# Patient Record
Sex: Female | Born: 1985 | Race: White | Hispanic: No | Marital: Single | Smoking: Current every day smoker
Health system: Southern US, Community
[De-identification: ages and names within clinical notes are randomized; demographics above are authoritative.]

## PROBLEM LIST (undated history)

## (undated) DIAGNOSIS — J45909 Unspecified asthma, uncomplicated: Secondary | ICD-10-CM

## (undated) DIAGNOSIS — F419 Anxiety disorder, unspecified: Secondary | ICD-10-CM

---
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
## 2004-09-30 ENCOUNTER — Emergency Department (HOSPITAL_COMMUNITY): Admission: EM | Admit: 2004-09-30 | Discharge: 2004-09-30 | Payer: Self-pay | Admitting: Emergency Medicine

## 2004-11-14 ENCOUNTER — Emergency Department (HOSPITAL_COMMUNITY): Admission: EM | Admit: 2004-11-14 | Discharge: 2004-11-14 | Payer: Self-pay | Admitting: Emergency Medicine

## 2004-11-24 ENCOUNTER — Ambulatory Visit: Payer: Self-pay | Admitting: Nurse Practitioner

## 2004-11-28 ENCOUNTER — Inpatient Hospital Stay (HOSPITAL_COMMUNITY): Admission: RE | Admit: 2004-11-28 | Discharge: 2004-12-04 | Payer: Self-pay | Admitting: Obstetrics

## 2004-11-29 ENCOUNTER — Encounter (INDEPENDENT_AMBULATORY_CARE_PROVIDER_SITE_OTHER): Payer: Self-pay | Admitting: *Deleted

## 2004-12-10 ENCOUNTER — Emergency Department (HOSPITAL_COMMUNITY): Admission: EM | Admit: 2004-12-10 | Discharge: 2004-12-10 | Payer: Self-pay | Admitting: Emergency Medicine

## 2005-04-01 ENCOUNTER — Inpatient Hospital Stay (HOSPITAL_COMMUNITY): Admission: AD | Admit: 2005-04-01 | Discharge: 2005-04-01 | Payer: Self-pay | Admitting: Obstetrics

## 2005-04-26 ENCOUNTER — Ambulatory Visit: Payer: Self-pay | Admitting: Family Medicine

## 2005-05-31 ENCOUNTER — Ambulatory Visit (HOSPITAL_COMMUNITY): Admission: RE | Admit: 2005-05-31 | Discharge: 2005-05-31 | Payer: Self-pay | Admitting: Obstetrics

## 2005-10-20 ENCOUNTER — Inpatient Hospital Stay (HOSPITAL_COMMUNITY): Admission: AD | Admit: 2005-10-20 | Discharge: 2005-10-22 | Payer: Self-pay | Admitting: Obstetrics

## 2007-10-14 ENCOUNTER — Emergency Department (HOSPITAL_COMMUNITY): Admission: EM | Admit: 2007-10-14 | Discharge: 2007-10-14 | Payer: Self-pay | Admitting: Emergency Medicine

## 2008-01-24 ENCOUNTER — Inpatient Hospital Stay (HOSPITAL_COMMUNITY): Admission: AD | Admit: 2008-01-24 | Discharge: 2008-01-24 | Payer: Self-pay | Admitting: Obstetrics & Gynecology

## 2008-08-26 ENCOUNTER — Emergency Department (HOSPITAL_COMMUNITY): Admission: EM | Admit: 2008-08-26 | Discharge: 2008-08-26 | Payer: Self-pay | Admitting: Emergency Medicine

## 2010-05-14 ENCOUNTER — Emergency Department (HOSPITAL_COMMUNITY): Admission: EM | Admit: 2010-05-14 | Discharge: 2010-05-14 | Payer: Self-pay | Admitting: Emergency Medicine

## 2010-12-01 NOTE — Consult Note (Signed)
NAMESHAUGHNESSY, GETHERS            ACCOUNT NO.:  [id number]   MEDICAL RECORD NO.:  [id number]          PATIENT TYPE:  INP   LOCATION:  9373                          FACILITY:  [facility]   PHYSICIAN:  Michael L. Reynolds, M.D.DATE OF BIRTH:  [birth year]   DATE OF CONSULTATION:  11/28/2004  DATE OF DISCHARGE:                                   CONSULTATION   REFERRED BY:  Roseanna Rainbow, M.D.   REASON FOR CONSULTATION:  Seizure.   HISTORY OF PRESENT ILLNESS:  This is the initial inpatient consultation  evaluation of this 25 year old woman with a past medical history which  includes depression for which she has been previously hospitalized. The  patient was admitted to [facility] on May 16 after presenting to a  routine prenatal visit at the gynecologist office and at that time was found  to have no fetal heart tones. She was preeclamptic at that time and was  admitted for evaluation. She was found to have an intrauterine fetal demise  and the baby was delivered without signs of life at about 1 p.m. yesterday.  Neurologic consultation requested for possible seizure. The patient reports  that there was a seizure episode about a week ago. She does not really  recall anything about the episode but she understands that she had some  shaking with twitching and turning of her arms and legs and loss of  consciousness. Again this happened a couple of days before she presented to  her OB's office. She denies having any previous history of any seizure  events or any alteration of consciousness. She does have a history of  illicit drug use and exactly what she took in the days prior to admission is  in question.   PAST MEDICAL HISTORY:  She has been hospitalized for anxiety and depression  in the past. She has a history of asthma for which she takes medications. Of  note, she was unaware of her pregnancy until about two weeks ago. There is  no previous history of seizure or previous  history of a head injury,  encephalitis or meningitis, etc.   FAMILY HISTORY:  Negative for seizure.   REVIEW OF SYMPTOMS:  Per HPI and admission H&P.   MEDICATIONS:  Medications prior to admission include hydrocodone, Advair,  albuterol and Singulair. In the hospital, she is presently on a magnesium  drip. She received Ativan, Phenergan and Ambien p.r.n.   SOCIAL HISTORY:  The patient plans to discharge home with her mother with  whom she resides. She does have a history of illicit drug use including  cocaine and marijuana in the past. Admission drug screen was positive for  barbiturates, the source of which is uncertain, but negative for THC,  opiates or cocaine. She has a difficult social situation regarding her  pregnancy which is well detailed in the progress notes.   PHYSICAL EXAMINATION:  VITAL SIGNS:  Temperature 98.3, blood pressure  130/80, pulse 85, respirations 20, O2 sat 95% on room air.  GENERAL:  This is a healthy appearing female supine in the hospital bed with  no evidence of distress.  HEENT:  Head normocephalic, atraumatic, oropharynx benign.  NECK:  Supple without carotid bruits.  HEART:  Regular rate and rhythm without murmurs.  NEUROLOGIC:  Mental status:  She is awake and alert, she is oriented to  place and time. She is appropriately interactive with the examiner. She is  able to name objects without difficulty and speech is clear and not  dysarthric. Cranial nerves:  Funduscopic exam is benign. Pupils are equal  and briskly reactive. Extraocular movements are full without nystagmus.  Visual fields are full to confrontation. Face, tongue and palate move  normally and symmetrically. Motor:  Normal bulk and tone, normal strength in  all tested extremity muscles.  Sensation: intact to light touch in all  extremities. Coordination:  Finger to nose performed adequately. Gait exam  is deferred as she is on bed rest. Reflexes 2+ and symmetric, toes are   downgoing.   LABORATORY DATA:  Labs from daily notes:  Yesterday she had an elevated  white count of 14.4, hemoglobin 12.3, platelets 218,000. CMET remarkable for  low total protein and albumin with an appropriately low calcium. This  morning white count is down to 13.2. Drug screen is as reported above. She  has had no neural imaging. CT of the head performed one year ago was  unremarkable. She has had no recent neural imaging.   IMPRESSION:  Reported history of a single generalized seizure occuring in  the setting of subsequent admission for toxemia. Suspect that this is likely  an eclamptic seizure. She has not had any previous history of seizures and  has a normal neurologic examination at this time, therefore, she is a low  risk of this happening again.   PLAN:  Would not treat for seizures right now. Given that she has not had  recent neuroimaging, it would be reasonable to recheck a CT of the head  without contrast. Beyond that, I would not suggest any further interventions  unless she has more events.   Thank you for the consultation.      MLR/MEDQ  D:  11/30/2004  T:  11/30/2004  Job:  [id number]
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
## 2010-12-01 NOTE — Op Note (Signed)
Joy Herrera, Joy Herrera            ACCOUNT NO.:  [id number]   MEDICAL RECORD NO.:  [id number]          PATIENT TYPE:  INP   LOCATION:  9118                          FACILITY:  [facility]   PHYSICIAN:  Kathreen Cosier, M.D.DATE OF BIRTH:  Mar 27, 1986   DATE OF PROCEDURE:  10/20/2005  DATE OF DISCHARGE:                                 OPERATIVE REPORT   DELIVERY NOTE:  The patient is a 25 year old, gravida 2, para 0-1-0-0, who  was admitted in labor, ruptured membranes, at term.  Became fully dilated at  7:00 p.m. and pushed for 2 hours to +3 station.  She was dilated, needed  help, and asked for vacuum.  A midline episiotomy was cut.  Vacuum applied  +3 station through one contraction, and the vertex was delivered.  There was  no pop-off.  LOA.  Intact nuchal cord cut prior to delivery.  Placenta was  spontaneous, intact.  Midline episiotomy repaired with 2-0 Vicryl.  Apgar  scores were 8 and 9.  The patient tolerated the procedure well.           ______________________________  Kathreen Cosier, M.D.     BAM/MEDQ  D:  10/20/2005  T:  10/22/2005  Job:  [id number]
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
## 2010-12-12 ENCOUNTER — Emergency Department (HOSPITAL_COMMUNITY)
Admission: EM | Admit: 2010-12-12 | Discharge: 2010-12-12 | Disposition: A | Discharge status: Home / Self Care | Payer: Medicaid Other | Attending: Emergency Medicine | Admitting: Emergency Medicine

## 2010-12-12 ENCOUNTER — Emergency Department (HOSPITAL_COMMUNITY): Payer: Medicaid Other

## 2010-12-12 DIAGNOSIS — J189 Pneumonia, unspecified organism: Secondary | ICD-10-CM | POA: Insufficient documentation

## 2010-12-12 DIAGNOSIS — R059 Cough, unspecified: Secondary | ICD-10-CM | POA: Insufficient documentation

## 2010-12-12 DIAGNOSIS — R05 Cough: Secondary | ICD-10-CM | POA: Insufficient documentation

## 2011-04-09 LAB — URINALYSIS, ROUTINE W REFLEX MICROSCOPIC
Bilirubin Urine: NEGATIVE
Specific Gravity, Urine: 1.025
Urobilinogen, UA: 1

## 2011-04-09 LAB — URINE MICROSCOPIC-ADD ON

## 2011-04-12 LAB — CBC
HCT: 38.7
Hemoglobin: 13.6
RBC: 4.26

## 2011-04-12 LAB — WET PREP, GENITAL: Trich, Wet Prep: NONE SEEN

## 2011-04-12 LAB — URINALYSIS, ROUTINE W REFLEX MICROSCOPIC
Bilirubin Urine: NEGATIVE
Glucose, UA: NEGATIVE
Nitrite: NEGATIVE
Specific Gravity, Urine: 1.02
pH: 6.5

## 2011-04-12 LAB — DIFFERENTIAL
Eosinophils Relative: 5
Lymphocytes Relative: 30
Monocytes Absolute: 0.3
Monocytes Relative: 5
Neutro Abs: 3.1

## 2011-04-12 LAB — GC/CHLAMYDIA PROBE AMP, GENITAL
Chlamydia, DNA Probe: NEGATIVE
GC Probe Amp, Genital: NEGATIVE

## 2011-07-03 ENCOUNTER — Emergency Department (HOSPITAL_COMMUNITY)
Admission: EM | Admit: 2011-07-03 | Discharge: 2011-07-03 | Disposition: A | Discharge status: Home / Self Care | Payer: Medicaid Other | Attending: Emergency Medicine | Admitting: Emergency Medicine

## 2011-07-03 ENCOUNTER — Encounter: Payer: Self-pay | Admitting: *Deleted

## 2011-07-03 DIAGNOSIS — G8929 Other chronic pain: Secondary | ICD-10-CM | POA: Insufficient documentation

## 2011-07-03 DIAGNOSIS — Z79899 Other long term (current) drug therapy: Secondary | ICD-10-CM | POA: Insufficient documentation

## 2011-07-03 DIAGNOSIS — M542 Cervicalgia: Secondary | ICD-10-CM | POA: Insufficient documentation

## 2011-07-03 MED ORDER — HYDROCODONE-ACETAMINOPHEN 5-325 MG PO TABS: 1.0000 | ORAL_TABLET | Freq: Four times a day (QID) | ORAL | Status: AC | PRN

## 2011-07-03 NOTE — ED Notes (Signed)
Pt has chronic neck pain but states that it has gotten worse in the last 2 weeks.

## 2011-07-03 NOTE — ED Provider Notes (Signed)
History     CSN: [id number] Arrival date & time: 07/03/2011  6:07 PM   None     Chief Complaint  Patient presents with  . Neck Pain    (Consider location/radiation/quality/duration/timing/severity/associated sxs/prior treatment) HPI Comments: Pt sees dr. Loney Hering in [place] for chronic neck pain.  She takes flexeril and valium regularly.  She states the [place] she takes let her sleep comfortably but she can only take it on the weekends because she must be able to wake up and get her son to school.  She plans to see dr. Loney Hering after christmas and plans to see a chiropractor.  i have suggested orthopedic evaluation first.  She wants pain medicine to help through he next week.  Patient is a 25 y.o. female presenting with neck pain. The history is provided by the patient. No language interpreter was used.  Neck Pain  This is a chronic problem. Episode onset: 8-10 yrs ago. The problem occurs constantly. There has been no fever. Pain location: R posterior lateral. The pain is worse during the night.    History reviewed. No pertinent past medical history.  History reviewed. No pertinent past surgical history.  History reviewed. No pertinent family history.  History  Substance Use Topics  . Smoking status: Former Games developer  . Smokeless tobacco: Not on file  . Alcohol Use: Yes     occasionally    OB History    Grav Para Term Preterm Abortions TAB SAB Ect Mult Living                  Review of Systems  HENT: Positive for neck pain.   Musculoskeletal:       Neck pain  All other systems reviewed and are negative.    Allergies  Review of patient's allergies indicates no known allergies.  Home Medications   Current Outpatient Rx  Name Route Sig Dispense Refill  . ALBUTEROL SULFATE HFA 108 (90 BASE) MCG/ACT IN AERS Inhalation Inhale 2 puffs into the lungs every 6 (six) hours as needed. For shortness of breath     . CYCLOBENZAPRINE HCL 10 MG PO TABS Oral Take 10 mg by mouth at  bedtime.      Marland Kitchen DIAZEPAM 5 MG PO TABS Oral Take 5 mg by mouth 3 (three) times daily as needed. For anxiety     . FLUTICASONE-SALMETEROL 500-50 MCG/DOSE IN AEPB Inhalation Inhale 1 puff into the lungs every 12 (twelve) hours.      . IBUPROFEN 200 MG PO TABS Oral Take 800 mg by mouth 2 (two) times daily.      Marland Kitchen MEDROXYPROGESTERONE ACETATE 150 MG/ML IM SUSP Intramuscular Inject 150 mg into the muscle every 3 (three) months.      Marland Kitchen NIACIN (ANTIHYPERLIPIDEMIC) 500 MG PO TBCR Oral Take 500-1,000 mg by mouth daily.      Marland Kitchen ZOLPIDEM TARTRATE 10 MG PO TABS Oral Take 10 mg by mouth at bedtime as needed. For sleep       BP 136/97  Pulse 88  Temp(Src) 98.5 F (36.9 C) (Oral)  Resp 16  Ht 5\' 4"  (1.626 m)  Wt 160 lb (72.576 kg)  BMI 27.46 kg/m2  SpO2 99%  Physical Exam  Nursing note and vitals reviewed. Constitutional: She is oriented to person, place, and time. She appears well-developed and well-nourished. No distress.  HENT:  Head: Normocephalic and atraumatic.  Eyes: EOM are normal.  Neck: Normal range of motion. Neck supple. No tracheal deviation present. No thyromegaly present.  Cardiovascular: Normal rate, regular rhythm and normal heart sounds.   Pulmonary/Chest: Effort normal and breath sounds normal.  Abdominal: Soft. She exhibits no distension. There is no tenderness.  Musculoskeletal: Normal range of motion.       Back:  Neurological: She is alert and oriented to person, place, and time. She has normal strength. No cranial nerve deficit or sensory deficit. GCS eye subscore is 4. GCS verbal subscore is 5. GCS motor subscore is 6.  Skin: Skin is warm and dry.  Psychiatric: She has a normal mood and affect. Judgment normal.    ED Course  Procedures (including critical care time)  Labs Reviewed - No data to display No results found.   No diagnosis found.    MDM          Worthy Rancher, PA 07/03/11 1941  Worthy Rancher, PA 07/04/11 [id number]

## 2011-07-04 NOTE — ED Provider Notes (Signed)
Medical screening examination/treatment/procedure(s) were performed by non-physician practitioner and as supervising physician I was immediately available for consultation/collaboration.  Nicoletta Dress. Colon Branch, MD 07/04/11 1253

## 2012-01-01 ENCOUNTER — Inpatient Hospital Stay (HOSPITAL_COMMUNITY)
Admission: EM | Admit: 2012-01-01 | Discharge: 2012-01-04 | DRG: 917 | Disposition: A | Discharge status: Home / Self Care | Payer: Medicaid Other | Attending: Internal Medicine | Admitting: Internal Medicine

## 2012-01-01 ENCOUNTER — Emergency Department (HOSPITAL_COMMUNITY): Payer: Medicaid Other

## 2012-01-01 ENCOUNTER — Encounter (HOSPITAL_COMMUNITY): Payer: Self-pay | Admitting: Emergency Medicine

## 2012-01-01 DIAGNOSIS — E876 Hypokalemia: Secondary | ICD-10-CM

## 2012-01-01 DIAGNOSIS — F17200 Nicotine dependence, unspecified, uncomplicated: Secondary | ICD-10-CM | POA: Diagnosis present

## 2012-01-01 DIAGNOSIS — D72819 Decreased white blood cell count, unspecified: Secondary | ICD-10-CM

## 2012-01-01 DIAGNOSIS — F411 Generalized anxiety disorder: Secondary | ICD-10-CM | POA: Diagnosis present

## 2012-01-01 DIAGNOSIS — N390 Urinary tract infection, site not specified: Secondary | ICD-10-CM | POA: Diagnosis present

## 2012-01-01 DIAGNOSIS — F1310 Sedative, hypnotic or anxiolytic abuse, uncomplicated: Secondary | ICD-10-CM | POA: Diagnosis present

## 2012-01-01 DIAGNOSIS — F1120 Opioid dependence, uncomplicated: Secondary | ICD-10-CM | POA: Diagnosis present

## 2012-01-01 DIAGNOSIS — T400X1A Poisoning by opium, accidental (unintentional), initial encounter: Secondary | ICD-10-CM | POA: Diagnosis present

## 2012-01-01 DIAGNOSIS — A419 Sepsis, unspecified organism: Secondary | ICD-10-CM | POA: Diagnosis present

## 2012-01-01 DIAGNOSIS — D649 Anemia, unspecified: Secondary | ICD-10-CM | POA: Diagnosis present

## 2012-01-01 DIAGNOSIS — F1910 Other psychoactive substance abuse, uncomplicated: Secondary | ICD-10-CM

## 2012-01-01 DIAGNOSIS — J45909 Unspecified asthma, uncomplicated: Secondary | ICD-10-CM | POA: Diagnosis present

## 2012-01-01 DIAGNOSIS — R509 Fever, unspecified: Secondary | ICD-10-CM

## 2012-01-01 DIAGNOSIS — T40601A Poisoning by unspecified narcotics, accidental (unintentional), initial encounter: Principal | ICD-10-CM | POA: Diagnosis present

## 2012-01-01 DIAGNOSIS — R000 Tachycardia, unspecified: Secondary | ICD-10-CM | POA: Diagnosis present

## 2012-01-01 HISTORY — DX: Anxiety disorder, unspecified: F41.9

## 2012-01-01 HISTORY — DX: Unspecified asthma, uncomplicated: J45.909

## 2012-01-01 LAB — URINALYSIS, ROUTINE W REFLEX MICROSCOPIC
Bilirubin Urine: NEGATIVE
Specific Gravity, Urine: 1.015 (ref 1.005–1.030)
pH: 5 (ref 5.0–8.0)

## 2012-01-01 LAB — DIFFERENTIAL
Basophils Absolute: 0 10*3/uL (ref 0.0–0.1)
Eosinophils Absolute: 0 10*3/uL (ref 0.0–0.7)
Eosinophils Relative: 0 % (ref 0–5)
Lymphocytes Relative: 2 % — ABNORMAL LOW (ref 12–46)

## 2012-01-01 LAB — CBC
MCH: 31.5 pg (ref 26.0–34.0)
MCV: 90.8 fL (ref 78.0–100.0)
Platelets: 270 10*3/uL (ref 150–400)
RDW: 11.8 % (ref 11.5–15.5)
WBC: 15.7 10*3/uL — ABNORMAL HIGH (ref 4.0–10.5)

## 2012-01-01 LAB — URINE MICROSCOPIC-ADD ON

## 2012-01-01 LAB — POCT PREGNANCY, URINE: Preg Test, Ur: NEGATIVE

## 2012-01-01 LAB — COMPREHENSIVE METABOLIC PANEL
ALT: 24 U/L (ref 0–35)
AST: 32 U/L (ref 0–37)
Albumin: 3.3 g/dL — ABNORMAL LOW (ref 3.5–5.2)
Calcium: 8.7 mg/dL (ref 8.4–10.5)
Sodium: 135 mEq/L (ref 135–145)
Total Protein: 6.3 g/dL (ref 6.0–8.3)

## 2012-01-01 LAB — RAPID URINE DRUG SCREEN, HOSP PERFORMED
Amphetamines: NOT DETECTED
Cocaine: NOT DETECTED
Opiates: NOT DETECTED
Tetrahydrocannabinol: NOT DETECTED

## 2012-01-01 MED ORDER — VANCOMYCIN HCL IN DEXTROSE 1-5 GM/200ML-% IV SOLN
1000.0000 mg | Freq: Once | INTRAVENOUS | Status: AC
  Administered 2012-01-02: 1000 mg via INTRAVENOUS
  Filled 2012-01-01: qty 200

## 2012-01-01 MED ORDER — POTASSIUM CHLORIDE 10 MEQ/100ML IV SOLN
10.0000 meq | Freq: Once | INTRAVENOUS | Status: AC
  Administered 2012-01-02: 10 meq via INTRAVENOUS

## 2012-01-01 MED ORDER — ONDANSETRON HCL 4 MG/2ML IJ SOLN
4.0000 mg | Freq: Once | INTRAMUSCULAR | Status: AC
  Administered 2012-01-01: 4 mg via INTRAVENOUS
  Filled 2012-01-01: qty 2

## 2012-01-01 MED ORDER — SODIUM CHLORIDE 0.9 % IV BOLUS (SEPSIS)
250.0000 mL | Freq: Once | INTRAVENOUS | Status: AC
  Administered 2012-01-01: 250 mL via INTRAVENOUS

## 2012-01-01 MED ORDER — POTASSIUM CHLORIDE 10 MEQ/100ML IV SOLN
10.0000 meq | Freq: Once | INTRAVENOUS | Status: AC
  Administered 2012-01-02: 10 meq via INTRAVENOUS
  Filled 2012-01-01 (×2): qty 100

## 2012-01-01 MED ORDER — SODIUM CHLORIDE 0.9 % IV SOLN: INTRAVENOUS | Status: DC

## 2012-01-01 MED ORDER — SODIUM CHLORIDE 0.9 % IV SOLN
INTRAVENOUS | Status: DC
  Administered 2012-01-02 (×2): via INTRAVENOUS

## 2012-01-01 MED ORDER — SODIUM CHLORIDE 0.9 % IV BOLUS (SEPSIS)
1000.0000 mL | Freq: Once | INTRAVENOUS | Status: AC
  Administered 2012-01-01: 1000 mL via INTRAVENOUS

## 2012-01-01 MED ORDER — PIPERACILLIN-TAZOBACTAM 3.375 G IVPB
3.3750 g | Freq: Once | INTRAVENOUS | Status: AC
  Administered 2012-01-02: 3.375 g via INTRAVENOUS
  Filled 2012-01-01: qty 50

## 2012-01-01 NOTE — ED Notes (Signed)
IV fluids started on patient. Resting in bed on back. Sinus tach at 135. sats 97% on room air. Call bell within reach. Equal chest rise and fall, regular, unlabored at 16x\minute. Will continue to monitor.

## 2012-01-01 NOTE — ED Notes (Signed)
Resting sitting up in bed. Sinus tach at 116. Alert and oriented. Equal chest rise and fall, regular, unlabored.

## 2012-01-01 NOTE — ED Notes (Signed)
Ambulatory to bathroom. Steady gait

## 2012-01-01 NOTE — ED Notes (Signed)
MD at bedside to evaluate.

## 2012-01-01 NOTE — ED Notes (Signed)
Remains resting sitting up in bed. No distress. Equal chest rise and fall, regular, unlabored. Sinus tach at 105 bpm. Call bell within reach. Family at bedside.

## 2012-01-01 NOTE — ED Notes (Signed)
Radiology at bedside for portable chest x-ray.

## 2012-01-01 NOTE — ED Notes (Signed)
Patient stated "I shot up 80mg  oxycodone approximately 45 minutes ago. I just want to try it." Denies suicidal ideation. Complaining of dizziness and left arm tingling. Patient alert and oriented x 4 at triage.

## 2012-01-01 NOTE — ED Notes (Signed)
Remains resting sitting up in bed. Denies needs. Call bell and mother at bedside. Equal chest rise and fall, regular, unlabored. Will continue to monitor.

## 2012-01-01 NOTE — ED Notes (Addendum)
Into room to see patient. Resting in bed on left side. No distress. States she shot up 80mg  oxycontin because her neck hurt. Complaints of chronic neck pain. Alert and oriented at this time. Forming complete sentences. Pupils 4mm bilateral. Equal and reactive. Clear lung sounds in all fields. Active bowel sounds in all fields. No abdominal tenderness; abdomen soft. Call bell within reach. Bed in low position and locked with side rails up. Will continue to monitor.

## 2012-01-01 NOTE — ED Provider Notes (Signed)
History   This chart was scribed for Shelda Jakes, MD by Shari Heritage. The patient was seen in room APA06/APA06. Patient's care was started at 2028.     CSN: [id number]  Arrival date & time 01/01/12  2028   First MD Initiated Contact with Patient 01/01/12 2108      Chief Complaint  Patient presents with  . Drug Overdose    (Consider location/radiation/quality/duration/timing/severity/associated sxs/prior treatment) Patient is a 26 y.o. female presenting with Overdose. The history is provided by the patient. No language interpreter was used.  Drug Overdose This is a new problem. The current episode started 1 to 2 hours ago. The problem occurs rarely. The problem has been gradually improving. Associated symptoms include headaches and shortness of breath. Pertinent negatives include no chest pain and no abdominal pain. Nothing aggravates the symptoms.   Joy Herrera is a 26 y.o. female who presents to the Emergency Department complaining of a drug overdose onset 1.75 hours ago. Patient took 80 mg of Oxycodone 45 minutes prior to arrival. Patient's HR was 154 upon arrival in the ED and is 138 now (9:37PM). Patient associated symptoms included chills, palpitations, HA, and SOB. Patient is currently having her period. Patient claims that she was not attempting suicide. Patient denies congestion, visual disturbance, chest pain, nausea, vomiting, abdominal pain, dysuria, neck pain, back pain, rash. Patient with medical h/o asthma and anxiety.  Past Medical History  Diagnosis Date  . Asthma   . Anxiety     History reviewed. No pertinent past surgical history.  History reviewed. No pertinent family history.  History  Substance Use Topics  . Smoking status: Current Everyday Smoker  . Smokeless tobacco: Not on file  . Alcohol Use: Yes     occasionally    OB History    Grav Para Term Preterm Abortions TAB SAB Ect Mult Living                  Review of Systems    Constitutional: Positive for chills. Negative for fever.  HENT: Negative for congestion and neck pain.   Eyes: Negative for visual disturbance.  Respiratory: Positive for shortness of breath.   Cardiovascular: Positive for palpitations. Negative for chest pain.  Gastrointestinal: Negative for nausea, vomiting, abdominal pain and diarrhea.  Genitourinary: Negative for dysuria.  Musculoskeletal: Negative for back pain.  Skin: Negative for rash.  Neurological: Positive for headaches.   Patient is positive for chills, palpitations, HA, and SOB. Patient is negative for congestion, fever, visual disturbance, chest pain, nausea, vomiting, diarrhea, abdominal pain, dysuria, neck pain, back pain, and rash.    Allergies  Review of patient's allergies indicates no known allergies.  Home Medications   Current Outpatient Rx  Name Route Sig Dispense Refill  . ALBUTEROL SULFATE HFA 108 (90 BASE) MCG/ACT IN AERS Inhalation Inhale 2 puffs into the lungs every 6 (six) hours as needed. For shortness of breath     . CYCLOBENZAPRINE HCL 10 MG PO TABS Oral Take 10 mg by mouth at bedtime.      Marland Kitchen DIAZEPAM 5 MG PO TABS Oral Take 5 mg by mouth 3 (three) times daily as needed. For anxiety     . ETONOGESTREL 68 MG [place] IMPL Subcutaneous Inject 1 each into the skin once.    Marland Kitchen FLUTICASONE-SALMETEROL 500-50 MCG/DOSE IN AEPB Inhalation Inhale 1 puff into the lungs every 12 (twelve) hours.      . IBUPROFEN 200 MG PO TABS Oral Take 800 mg by  mouth 2 (two) times daily.        BP 118/71  Pulse 121  Temp 101 F (38.3 C) (Oral)  Resp 20  Ht 5\' 6"  (1.676 m)  Wt 156 lb (70.761 kg)  BMI 25.18 kg/m2  SpO2 97%  LMP 01/01/2012  Physical Exam  Nursing note and vitals reviewed. Constitutional: She is oriented to person, place, and time. She appears well-developed and well-nourished.  HENT:  Head: Normocephalic and atraumatic.  Eyes: Conjunctivae and EOM are normal. Pupils are equal, round, and reactive to light.   Neck: Normal range of motion. Neck supple.  Cardiovascular: Regular rhythm and normal heart sounds.  Tachycardia present.   No murmur heard. Pulmonary/Chest: Effort normal and breath sounds normal. No respiratory distress. She has no wheezes. She has no rales.  Abdominal: Soft. Bowel sounds are normal. There is no tenderness.  Musculoskeletal: Normal range of motion.  Neurological: She is alert and oriented to person, place, and time.  Skin: Skin is warm and dry.  Psychiatric: She has a normal mood and affect.    ED Course  Procedures (including critical care time) DIAGNOSTIC STUDIES: Oxygen Saturation is 100% on room air, normal by my interpretation.    COORDINATION OF CARE: 9:35PM- Patient informed of current plan for treatment and evaluation and agrees with plan at this time. Will order labs and cardiac monitoring.   Results for orders placed during the hospital encounter of 01/01/12  CBC      Component Value Range   WBC 15.7 (*) 4.0 - 10.5 K/uL   RBC 3.71 (*) 3.87 - 5.11 MIL/uL   Hemoglobin 11.7 (*) 12.0 - 15.0 g/dL   HCT 21.3 (*) 08.6 - 57.8 %   MCV 90.8  78.0 - 100.0 fL   MCH 31.5  26.0 - 34.0 pg   MCHC 34.7  30.0 - 36.0 g/dL   RDW 46.9  62.9 - 52.8 %   Platelets 270  150 - 400 K/uL  DIFFERENTIAL      Component Value Range   Neutrophils Relative 97 (*) 43 - 77 %   Neutro Abs 15.2 (*) 1.7 - 7.7 K/uL   Lymphocytes Relative 2 (*) 12 - 46 %   Lymphs Abs 0.3 (*) 0.7 - 4.0 K/uL   Monocytes Relative 1 (*) 3 - 12 %   Monocytes Absolute 0.2  0.1 - 1.0 K/uL   Eosinophils Relative 0  0 - 5 %   Eosinophils Absolute 0.0  0.0 - 0.7 K/uL   Basophils Relative 0  0 - 1 %   Basophils Absolute 0.0  0.0 - 0.1 K/uL  COMPREHENSIVE METABOLIC PANEL      Component Value Range   Sodium 135  135 - 145 mEq/L   Potassium 2.9 (*) 3.5 - 5.1 mEq/L   Chloride 102  96 - 112 mEq/L   CO2 22  19 - 32 mEq/L   Glucose, Bld 104 (*) 70 - 99 mg/dL   BUN 8  6 - 23 mg/dL   Creatinine, Ser 4.13  0.50 -  1.10 mg/dL   Calcium 8.7  8.4 - 24.4 mg/dL   Total Protein 6.3  6.0 - 8.3 g/dL   Albumin 3.3 (*) 3.5 - 5.2 g/dL   AST 32  0 - 37 U/L   ALT 24  0 - 35 U/L   Alkaline Phosphatase 42  39 - 117 U/L   Total Bilirubin 0.3  0.3 - 1.2 mg/dL   GFR calc non Af Amer >90  >  90 mL/min   GFR calc Af Amer >90  >90 mL/min  URINALYSIS, ROUTINE W REFLEX MICROSCOPIC      Component Value Range   Color, Urine YELLOW  YELLOW   APPearance CLEAR  CLEAR   Specific Gravity, Urine 1.015  1.005 - 1.030   pH 5.0  5.0 - 8.0   Glucose, UA NEGATIVE  NEGATIVE mg/dL   Hgb urine dipstick TRACE (*) NEGATIVE   Bilirubin Urine NEGATIVE  NEGATIVE   Ketones, ur NEGATIVE  NEGATIVE mg/dL   Protein, ur NEGATIVE  NEGATIVE mg/dL   Urobilinogen, UA 0.2  0.0 - 1.0 mg/dL   Nitrite NEGATIVE  NEGATIVE   Leukocytes, UA SMALL (*) NEGATIVE  PREGNANCY, URINE      Component Value Range   Preg Test, Ur NEGATIVE  NEGATIVE  URINE RAPID DRUG SCREEN (HOSP PERFORMED)      Component Value Range   Opiates NONE DETECTED  NONE DETECTED   Cocaine NONE DETECTED  NONE DETECTED   Benzodiazepines NONE DETECTED  NONE DETECTED   Amphetamines NONE DETECTED  NONE DETECTED   Tetrahydrocannabinol NONE DETECTED  NONE DETECTED   Barbiturates NONE DETECTED  NONE DETECTED  ACETAMINOPHEN LEVEL      Component Value Range   Acetaminophen (Tylenol), Serum <15.0  10 - 30 ug/mL  ETHANOL      Component Value Range   Alcohol, Ethyl (B) <11  0 - 11 mg/dL  POCT PREGNANCY, URINE      Component Value Range   Preg Test, Ur NEGATIVE  NEGATIVE  URINE MICROSCOPIC-ADD ON      Component Value Range   Squamous Epithelial / LPF RARE  RARE   WBC, UA 21-50  <3 WBC/hpf   RBC / HPF 3-6  <3 RBC/hpf   Bacteria, UA FEW (*) RARE   Urine-Other TRICHOMONAS PRESENT      Date: 01/01/2012  Rate: 140  Rhythm: sinus tachycardia  QRS Axis: normal  Intervals: normal  ST/T Wave abnormalities: nonspecific ST changes  Conduction Disutrbances:none  Narrative Interpretation:    Old EKG Reviewed: none available     Dg Chest Portable 1 View  01/02/2012  *RADIOLOGY REPORT*  Clinical Data: Shortness of breath.  Palpitations.  Drug overdose.  CHEST - 1 VIEW  Comparison:  None.  Findings: The heart size and mediastinal contours are within normal limits.  Both lungs are clear.  IMPRESSION: No active disease.  Original Report Authenticated By: Danae Orleans, M.D.                  1. Drug abuse   2. Fever   3. Tachycardia   4. Hypokalemia     CRITICAL CARE Performed by: Shelda Jakes.   Total critical care time: 30  Critical care time was exclusive of separately billable procedures and treating other patients.  Critical care was necessary to treat or prevent imminent or life-threatening deterioration.  Critical care was time spent personally by me on the following activities: development of treatment plan with patient and/or surrogate as well as nursing, discussions with consultants, evaluation of patient's response to treatment, examination of patient, obtaining history from patient or surrogate, ordering and performing treatments and interventions, ordering and review of laboratory studies, ordering and review of radiographic studies, pulse oximetry and re-evaluation of patient's condition.   MDM   Patient presentedon her own following an IV injection of 80 mg of oxycodone approximately 45 minutes prior to arrival patient initially stated that it was not a suicidal attempt and  that she just wanted to try the sounds of the stomach she done before does sound as if is not suicidal she started complaining of dizziness and tingling in the left arm and the patient and get normal feeling that she usually does inject a normal of "" she'll filling and the sedation feeling. Patient arrived in the significant tachycardia 154 initially blood pressure 140/89 temp was 99.7 initially was thinking that due to the tachycardia may have not injected herself with oxycodone  the patient stated that she ground up the pills herself and that the she does not use anything else urine drug screen is negative for things here she spiked a temp to 103 then got more concern for perhaps a pre-sepsis or bacteremia going on and off patient will require admission. Urine is definitely infected the patient is not having back pain or abdominal pain, it is possibility that could be driving the fever and perhaps tachycardia. Blood cultures were done urine was sent for culture following this patient's to get Zosyn and vancomycin patient may need an echocardiogram to rule out any vegetative symptoms on her valves resulting of this being endocarditis. Patient remained tachycardic the blood pressure is remained normal down from 140 initially now more around 1 04/15/2012 heart rate spent more around the 120s to upper 1 teens. Patient continues to mentate fine and has no chest pain has no abdominal pain.  Discussed with hospitalist they will come and see the patient patient will probably require step down admission. Septic markers have been sent as well but are not back yet patient does have a leukocytosis as mentioned the urine is positive positive for white blood cell and some Trichomonas. Do not think the patient has PID patient doesn't have any abdominal tenderness. Patient did feel as if she had a urinary tract infection for the past few days. Patient has a history of asthma she is not wheezing here chest x-ray is negative for pneumonia.     I personally performed the services described in this documentation, which was scribed in my presence. The recorded information has been reviewed and considered.     Shelda Jakes, MD 01/02/12 [phone]

## 2012-01-01 NOTE — ED Notes (Signed)
Denies nausea. Sinus tach at 106 bpm. No distress. Equal chest rise and fall, regular, unlabored.

## 2012-01-02 ENCOUNTER — Encounter (HOSPITAL_COMMUNITY): Payer: Self-pay | Admitting: Intensive Care

## 2012-01-02 ENCOUNTER — Emergency Department (HOSPITAL_COMMUNITY): Payer: Medicaid Other

## 2012-01-02 DIAGNOSIS — F1120 Opioid dependence, uncomplicated: Secondary | ICD-10-CM

## 2012-01-02 DIAGNOSIS — D649 Anemia, unspecified: Secondary | ICD-10-CM

## 2012-01-02 DIAGNOSIS — D72819 Decreased white blood cell count, unspecified: Secondary | ICD-10-CM

## 2012-01-02 DIAGNOSIS — N390 Urinary tract infection, site not specified: Secondary | ICD-10-CM

## 2012-01-02 DIAGNOSIS — A413 Sepsis due to Hemophilus influenzae: Secondary | ICD-10-CM

## 2012-01-02 DIAGNOSIS — E876 Hypokalemia: Secondary | ICD-10-CM

## 2012-01-02 DIAGNOSIS — E782 Mixed hyperlipidemia: Secondary | ICD-10-CM

## 2012-01-02 DIAGNOSIS — A419 Sepsis, unspecified organism: Secondary | ICD-10-CM

## 2012-01-02 DIAGNOSIS — F1110 Opioid abuse, uncomplicated: Secondary | ICD-10-CM

## 2012-01-02 LAB — RAPID URINE DRUG SCREEN, HOSP PERFORMED
Barbiturates: NOT DETECTED
Cocaine: NOT DETECTED
Tetrahydrocannabinol: NOT DETECTED

## 2012-01-02 LAB — HEPATIC FUNCTION PANEL
ALT: 24 U/L (ref 0–35)
AST: 33 U/L (ref 0–37)
Albumin: 3.3 g/dL — ABNORMAL LOW (ref 3.5–5.2)
Alkaline Phosphatase: 43 U/L (ref 39–117)
Total Bilirubin: 0.3 mg/dL (ref 0.3–1.2)
Total Protein: 6.3 g/dL (ref 6.0–8.3)

## 2012-01-02 LAB — TSH: TSH: 0.445 u[IU]/mL (ref 0.350–4.500)

## 2012-01-02 LAB — MAGNESIUM: Magnesium: 1.4 mg/dL — ABNORMAL LOW (ref 1.5–2.5)

## 2012-01-02 LAB — PROCALCITONIN: Procalcitonin: 28.56 ng/mL

## 2012-01-02 LAB — LACTIC ACID, PLASMA: Lactic Acid, Venous: 1 mmol/L (ref 0.5–2.2)

## 2012-01-02 MED ORDER — THIAMINE HCL 100 MG/ML IJ SOLN
Freq: Once | INTRAVENOUS | Status: AC
  Administered 2012-01-02: 05:00:00 via INTRAVENOUS
  Filled 2012-01-02: qty 1000

## 2012-01-02 MED ORDER — ENOXAPARIN SODIUM 40 MG/0.4ML ~~LOC~~ SOLN
40.0000 mg | SUBCUTANEOUS | Status: DC
  Administered 2012-01-02: 40 mg via SUBCUTANEOUS
  Filled 2012-01-02 (×2): qty 0.4

## 2012-01-02 MED ORDER — M.V.I. ADULT IV INJ
INJECTION | INTRAVENOUS | Status: AC
  Filled 2012-01-02: qty 10

## 2012-01-02 MED ORDER — ONDANSETRON HCL 4 MG/2ML IJ SOLN: 4.0000 mg | Freq: Four times a day (QID) | INTRAMUSCULAR | Status: DC | PRN

## 2012-01-02 MED ORDER — PIPERACILLIN-TAZOBACTAM 3.375 G IVPB
3.3750 g | Freq: Three times a day (TID) | INTRAVENOUS | Status: DC
  Administered 2012-01-02 – 2012-01-04 (×5): 3.375 g via INTRAVENOUS
  Filled 2012-01-02 (×16): qty 50

## 2012-01-02 MED ORDER — ONDANSETRON HCL 4 MG PO TABS: 4.0000 mg | ORAL_TABLET | Freq: Four times a day (QID) | ORAL | Status: DC | PRN

## 2012-01-02 MED ORDER — SODIUM CHLORIDE 0.9 % IJ SOLN
INTRAMUSCULAR | Status: AC
  Administered 2012-01-02: 3 mL
  Filled 2012-01-02: qty 3

## 2012-01-02 MED ORDER — FLUTICASONE-SALMETEROL 500-50 MCG/DOSE IN AEPB
1.0000 | INHALATION_SPRAY | Freq: Two times a day (BID) | RESPIRATORY_TRACT | Status: DC
  Administered 2012-01-02: 1 via RESPIRATORY_TRACT
  Filled 2012-01-02: qty 14

## 2012-01-02 MED ORDER — SENNOSIDES-DOCUSATE SODIUM 8.6-50 MG PO TABS: 1.0000 | ORAL_TABLET | Freq: Every evening | ORAL | Status: DC | PRN

## 2012-01-02 MED ORDER — VANCOMYCIN HCL IN DEXTROSE 1-5 GM/200ML-% IV SOLN
1000.0000 mg | Freq: Three times a day (TID) | INTRAVENOUS | Status: DC
  Administered 2012-01-02 – 2012-01-04 (×5): 1000 mg via INTRAVENOUS
  Filled 2012-01-02 (×16): qty 200

## 2012-01-02 MED ORDER — FOLIC ACID 5 MG/ML IJ SOLN
INTRAMUSCULAR | Status: AC
  Filled 2012-01-02: qty 0.2

## 2012-01-02 MED ORDER — FLUTICASONE-SALMETEROL 500-50 MCG/DOSE IN AEPB
INHALATION_SPRAY | RESPIRATORY_TRACT | Status: AC
  Filled 2012-01-02: qty 14

## 2012-01-02 MED ORDER — LORAZEPAM 1 MG PO TABS
1.0000 mg | ORAL_TABLET | ORAL | Status: DC | PRN
  Administered 2012-01-02 – 2012-01-04 (×7): 1 mg via ORAL
  Filled 2012-01-02 (×6): qty 1
  Filled 2012-01-02: qty 2

## 2012-01-02 MED ORDER — ALBUTEROL SULFATE (5 MG/ML) 0.5% IN NEBU: 2.5000 mg | INHALATION_SOLUTION | RESPIRATORY_TRACT | Status: DC | PRN

## 2012-01-02 MED ORDER — ACETAMINOPHEN 650 MG RE SUPP: 650.0000 mg | Freq: Four times a day (QID) | RECTAL | Status: DC | PRN

## 2012-01-02 MED ORDER — POTASSIUM CHLORIDE CRYS ER 20 MEQ PO TBCR
40.0000 meq | EXTENDED_RELEASE_TABLET | Freq: Once | ORAL | Status: AC
  Administered 2012-01-02: 40 meq via ORAL
  Filled 2012-01-02: qty 2

## 2012-01-02 MED ORDER — THIAMINE HCL 100 MG/ML IJ SOLN
INTRAMUSCULAR | Status: AC
  Filled 2012-01-02: qty 2

## 2012-01-02 MED ORDER — SODIUM CHLORIDE 0.9 % IV BOLUS (SEPSIS)
1000.0000 mL | Freq: Once | INTRAVENOUS | Status: AC
  Administered 2012-01-02: 1000 mL via INTRAVENOUS

## 2012-01-02 MED ORDER — DOCUSATE SODIUM 100 MG PO CAPS
100.0000 mg | ORAL_CAPSULE | Freq: Two times a day (BID) | ORAL | Status: DC
  Administered 2012-01-02: 100 mg via ORAL
  Filled 2012-01-02: qty 1

## 2012-01-02 MED ORDER — ALUM & MAG HYDROXIDE-SIMETH 200-200-20 MG/5ML PO SUSP: 30.0000 mL | Freq: Four times a day (QID) | ORAL | Status: DC | PRN

## 2012-01-02 MED ORDER — ACETAMINOPHEN 325 MG PO TABS
650.0000 mg | ORAL_TABLET | Freq: Four times a day (QID) | ORAL | Status: DC | PRN
  Administered 2012-01-02 – 2012-01-04 (×3): 650 mg via ORAL
  Filled 2012-01-02 (×3): qty 2

## 2012-01-02 MED ORDER — SODIUM CHLORIDE 0.9 % IJ SOLN
INTRAMUSCULAR | Status: AC
  Administered 2012-01-02: 17:00:00
  Filled 2012-01-02: qty 3

## 2012-01-02 NOTE — Progress Notes (Signed)
Patient requested AMA paper to be signed. Patient requested all IV's be removed. Patient asked if she could be transferred to another hospital. MD notified and said no she could not be transferred. Patient given AMA paper to sign and she said "she wasn't leaving right now. She would sign the paper when she felt like leaving." Patient is alert, oriented and in stable condition. Patient is sitting in bed eating breakfast.

## 2012-01-02 NOTE — ED Notes (Signed)
MD at bedside to speak with patient about plan of care.

## 2012-01-02 NOTE — Progress Notes (Signed)
ANTIBIOTIC CONSULT NOTE - INITIAL  Pharmacy Consult for Vancomycin & Zosyn Indication: rule out sepsis  No Known Allergies  Patient Measurements: Height: 5\' 4"  (162.6 cm) Weight: 164 lb 10.9 oz (74.7 kg) IBW/kg (Calculated) : 54.7   Vital Signs: Temp: 100.7 F (38.2 C) (06/19 0255) Temp src: Oral (06/19 0255) BP: 102/58 mmHg (06/19 0700) Pulse Rate: 79  (06/19 0700) Intake/Output from previous day: 06/18 0701 - 06/19 0700 In: 1203.3 [P.O.:240; I.V.:913.3; IV Piggyback:50] Out: 400 [Urine:400] Intake/Output from this shift: Total I/O In: 160 [I.V.:160] Out: -   Labs:  [facility] 01/01/12 2152  WBC 15.7*  HGB 11.7*  PLT 270  LABCREA --  CREATININE 0.71   Estimated Creatinine Clearance: 104.6 ml/min (by C-G formula based on Cr of 0.71). No results found for this basename: VANCOTROUGH:2,VANCOPEAK:2,VANCORANDOM:2,GENTTROUGH:2,GENTPEAK:2,GENTRANDOM:2,TOBRATROUGH:2,TOBRAPEAK:2,TOBRARND:2,AMIKACINPEAK:2,AMIKACINTROU:2,AMIKACIN:2, in the last 72 hours   Microbiology: Recent Results (from the past 720 hour(s))  CULTURE, BLOOD (ROUTINE X 2)     Status: Normal (Preliminary result)   Collection Time   01/02/12 12:49 AM      Component Value Range Status Comment   Specimen Description BLOOD LEFT HAND   Final    Special Requests BOTTLES DRAWN AEROBIC ONLY 4CC   Final    Culture PENDING   Incomplete    Report Status PENDING   Incomplete   CULTURE, BLOOD (ROUTINE X 2)     Status: Normal (Preliminary result)   Collection Time   01/02/12 12:49 AM      Component Value Range Status Comment   Specimen Description BLOOD LEFT ARM   Final    Special Requests BOTTLES DRAWN AEROBIC AND ANAEROBIC 4CC EACH   Final    Culture PENDING   Incomplete    Report Status PENDING   Incomplete   MRSA PCR SCREENING     Status: Normal   Collection Time   01/02/12  3:18 AM      Component Value Range Status Comment   MRSA by PCR NEGATIVE  NEGATIVE Final     Medical History: Past Medical History    Diagnosis Date  . Asthma   . Anxiety     Medications:  Scheduled:    . docusate sodium  100 mg Oral BID  . enoxaparin  40 mg Subcutaneous Q24H  . Fluticasone-Salmeterol  1 puff Inhalation Q12H  . ondansetron  4 mg Intravenous Once  . piperacillin-tazobactam  3.375 g Intravenous Once  . piperacillin-tazobactam (ZOSYN)  IV  3.375 g Intravenous Q8H  . potassium chloride  10 mEq Intravenous Once  . potassium chloride  10 mEq Intravenous Once  . potassium chloride  40 mEq Oral Once  . general admission iv infusion   Intravenous Once  . sodium chloride  1,000 mL Intravenous Once  . sodium chloride  1,000 mL Intravenous Once  . sodium chloride  250 mL Intravenous Once  . sodium chloride  250 mL Intravenous Once  . vancomycin  1,000 mg Intravenous Once  . vancomycin  1,000 mg Intravenous Q8H   Assessment: 26 yo F who was admitted after unintentional overdose of IV oxycontin.  Her WBC and procalcitonin levels are elevated suggesting infection. Blood cultures pending. Starting empiric antibiotics for possible sepsis +/- endocarditis.  Her renal function is normal.   Goal of Therapy:  Vancomycin trough level 15-20 mcg/ml  Plan:  1) Zosyn 3.375gm IV Q8h to be infused over 4hrs 2) Vancomcyin 1gm IV Q8h 3) Check Vancomycin trough at steady state 4) Monitor renal function and cx data  Elson Clan 01/02/2012,10:16 AM

## 2012-01-02 NOTE — H&P (Signed)
[facility] History and Physical  Joy Herrera ZOX:[id number] DOB: 1986/01/31 DOA: 01/01/2012  Referring physician: EDP PCP: No primary provider on file.   Chief Complaint: Rapid Heart rate after IV drug use  HPI:  Joy Herrera is a 26 year old woman with a past medical history significant for addictive disease and asthma who presented to the emergency department his evening after developing palpitations, chills and weakness following the injection of 80 mg of OxyContin that she obtained a illegally. He has a long history of illegal drug use that started in her teenage years which includes crack cocaine abuse, opiate abuse, and benzodiazepine abuse. She reports being crack cocaine free for the past 7 years but had a long and difficult recovery from that addiction. According to her mother, the patient weighed 98 lbs when she stopped using crack. For the past several years the patient has been abusing prescription opiates. She reports "eating them by the dozens" and not getting any effect, then moved to "snorting them", when this no longer became effective for her "high" she started crushing the pills and dissolving them to inject. Typically she reports boiling the water she uses for injection but this evening she crushed and dissolved the oxycontin and drew it up through a piece of cotton without heating it.  She injects at least once a week but also reports injecting several days in a row. She obtained the "roxies" off the street but says they appeared to be untampered. She uses clean disposable needles and does not share needles. Other than occasional mild asthma symptoms she reports being in good health and leads a fairly normal life despite her severe addiction.  Currently she feels palpitations in her chest and intense anxiety, but no chest pain or pressure. She has fever and chills and feel very weak. No cough or URI symptoms. She complains of supra-pubic pain and dysuria. No HA or other  neurological complaints.  Review of Systems:  Review of Systems  Constitutional: Positive for fever, chills, weight loss and malaise/fatigue. Negative for diaphoresis.  HENT: Negative for congestion and sore throat.   Respiratory: Negative for cough and wheezing.   Cardiovascular: Positive for palpitations. Negative for chest pain, orthopnea and claudication.  Gastrointestinal: Negative for nausea, vomiting and abdominal pain.  Genitourinary: Positive for dysuria and urgency.  Musculoskeletal: Negative for back pain and joint pain.  Skin: Negative for itching and rash.  Neurological: Positive for weakness. Negative for dizziness, tingling, tremors, sensory change, speech change, focal weakness, seizures and headaches.  Psychiatric/Behavioral: Positive for substance abuse. Negative for depression, suicidal ideas and hallucinations. The patient is nervous/anxious and has insomnia.      Past Medical History  Diagnosis Date  . Asthma   . Anxiety    History reviewed. No pertinent past surgical history. Social History:  reports that she has been smoking Cigarettes.  She has a 2.5 pack-year smoking history. She does not have any smokeless tobacco history on file. She reports that she drinks alcohol. She reports that she uses illicit drugs (Marijuana).  No Known Allergies  History reviewed. No pertinent family history.  Prior to Admission medications   Medication Sig Start Date End Date Taking? Authorizing Provider  albuterol (PROVENTIL HFA;VENTOLIN HFA) 108 (90 BASE) MCG/ACT inhaler Inhale 2 puffs into the lungs every 6 (six) hours as needed. For shortness of breath   Yes Historical Provider, MD  cyclobenzaprine (FLEXERIL) 10 MG tablet Take 10 mg by mouth at bedtime.     Yes Historical Provider, MD  diazepam (VALIUM) 5 MG tablet Take 5 mg by mouth 3 (three) times daily as needed. For anxiety    Yes Historical Provider, MD  etonogestrel (IMPLANON) 68 MG IMPL implant Inject 1 each into the  skin once.   Yes Historical Provider, MD  Fluticasone-Salmeterol (ADVAIR) 500-50 MCG/DOSE AEPB Inhale 1 puff into the lungs every 12 (twelve) hours.     Yes Historical Provider, MD  ibuprofen (ADVIL,MOTRIN) 200 MG tablet Take 800 mg by mouth 2 (two) times daily.     Yes Historical Provider, MD   Physical Exam: Filed Vitals:   01/02/12 0002 01/02/12 0107 01/02/12 0255 01/02/12 0340  BP: 118/71 97/49 98/60  103/68  Pulse: 121 112 105 91  Temp: 101 F (38.3 C) 101 F (38.3 C) 100.7 F (38.2 C)   TempSrc: Oral Oral Oral   Resp:  16 20 15   Height:    5\' 4"  (1.626 m)  Weight:    74.7 kg (164 lb 10.9 oz)  SpO2: 97% 96% 96% 100%     General:  Anxious white female, appear stated age, well groomed, normal weight  Eyes: PERRL, EOMI  ENT: MMM, no throat exudates  Neck: supple no adenopathy  Cardiovascular: Tachycardic, Normal S1S2 regular  Respiratory: CTAB  Abdomen: Soft, non tender  Skin: No rashes, or wounds, no injection site redness or drainage  Musculoskeletal: Moves all 4 extremities  Psychiatric: Anxious, slightly pressured speech  Neurologic: No focal deficits, CN intact  Labs on Admission:  Basic Metabolic Panel:  Lab 01/01/12 1610  NA 135  K 2.9*  CL 102  CO2 22  GLUCOSE 104*  BUN 8  CREATININE 0.71  CALCIUM 8.7  MG --  PHOS --   Liver Function Tests:  Lab 01/01/12 2152  AST 32  ALT 24  ALKPHOS 42  BILITOT 0.3  PROT 6.3  ALBUMIN 3.3*   No results found for this basename: LIPASE:5,AMYLASE:5 in the last 168 hours No results found for this basename: AMMONIA:5 in the last 168 hours CBC:  Lab 01/01/12 2152  WBC 15.7*  NEUTROABS 15.2*  HGB 11.7*  HCT 33.7*  MCV 90.8  PLT 270   Radiological Exams on Admission: Dg Chest Portable 1 View  01/02/2012  *RADIOLOGY REPORT*  Clinical Data: Shortness of breath.  Palpitations.  Drug overdose.  CHEST - 1 VIEW  Comparison:  None.  Findings: The heart size and mediastinal contours are within normal limits.   Both lungs are clear.  IMPRESSION: No active disease.  Original Report Authenticated By: Danae Orleans, M.D.    EKG: Sinus Tachycardia  Assessment/Plan Active Problems:  Overdose  Sepsis  UTI (lower urinary tract infection)  Opiate addiction  Leukopenia  Hypokalemia  Anemia   1. Opiate Addiction/ IV drug abuse-Overdose vs.Adverse effect/complication of illicit drug use - Tachycardia and chills/rigors started about 30 minutes after injecting oxcodone suggests bacteremia/transient. She is febrile to 101, tachycardic to max of 150's, elevated WBC suggesting possible sepsis. High risk for endocarditis with IV drug use. Counseled the patient on life threatening consequences of IV drug use, encouraged rehab/recovery.  Plan:  1. IV Vanc and Zosyn 2. Await Blood Culture results and if needed proceed with BE work up 3. SW consult for rehab options or methadone clinic option 4. Monitor in step-down 5. CIWA protocol 6. Lorazepam for withdrawal symptoms/may need opiate taper or methadone 7. Ordered a comprehensive urine drug evaluation  2. Leukopenia Unclear etiology, may be from viral/bacterial infection. Will also check HIV antibody and Hepatitis  Panel for transmittable disease.  3. UTI Significant pyuria. Covered with Vanc/Zosyn. Maybe severe UTI/Urosepsis contributing to severity of illness. Await urine culture.  4. Hypokalemia Unclear etiology. Renal function normal. May be Magnesium deficient/nutritionally depleted from drug use and self neglect. Replete oral and IV, repeat in AM.  5. Anemia Mild anemia, will check anemia panel.   Code Status: Full Code Family Communication: Plan of care discussed in detail with patient and her mother who was at the bedside. Disposition Plan:  Ideally she would do best in residential drug rehab, but this may not be a reality financially for her. CSW assist with options and choices.   Anderson Malta, MD  [facility] Pager  [phone]  If 7PM-7AM, please contact night-coverage [email or web address] Password [email or web address] 01/02/2012, 4:05 AM

## 2012-01-02 NOTE — ED Notes (Signed)
Remains resting sitting up in bed. No distress. Denies needs. Call bell within reach. Pain 7\10. Sinus tach at 103. BP 99/57.

## 2012-01-02 NOTE — Clinical Social Work Note (Signed)
[place] with CPS requested H&P and MD note for today which CSW provided.  Derenda Fennel, [place] [phone]

## 2012-01-02 NOTE — ED Notes (Signed)
Resting in bed on back. Pain 7\10. Denies needs. No distress. Sinus tach at 105 bpm. Call bell within reach. Will continue to monitor.

## 2012-01-02 NOTE — Progress Notes (Signed)
Report given to Lindaann Slough, RN. Pt transferred to dept 300. Pt alert, oriented and in stable condition at the time of transport. Pt transported in wheelchair with significant other present.  ACT team called for consultation. Spoke with Tommy over phone. He will come see patient shortly.

## 2012-01-02 NOTE — ED Notes (Signed)
Hospitalist at bedside to evaluate.

## 2012-01-02 NOTE — Clinical Social Work Note (Signed)
Spoke w Z Brooks, Asst Dir of SW, who advised making CPS report to [facility] DSS due to significant concern w maternal substance use and presence of minor child in home.  Called CPS and left VM for CPS worker to return call.  Sallee Lange, [place] Clinical Social Worker [phone])

## 2012-01-02 NOTE — Clinical Social Work Note (Signed)
Made CPS report to [facility] DSS [place]).  Per staff, patient lives w 27 year old son and is a current substance user.  CPS stated it was not an "emergency" and they investigate situation at home.    Clovis Cao Clinical Social Worker [phone])

## 2012-01-02 NOTE — BH Assessment (Signed)
Assessment Note   Joy Herrera is an 26 y.o. female. The patient was admitted through the ED. She had overdosed on an injection of 80 mg of oxycodone. She had begun to feel bad after about an hour and had come to the ED. While in ICU, she had started to talk about  leaving AMA, because she was not being treated for the correct problem and not with the correct medication. She has constantly refused to cooperate with staff. Most of the time she will not answer question about her, her significant other, nor the home environment. According to notes, she lives with her 27 year old. At this time he is with the grandparent. Today when seen in her room, she refused to answerer questions. She denied any issues with mental health or substance abuse. He significant other was in the bed with her and rudely explained that she did not have any problems and did not want any help. He then told this Clinical research associate to leave. Writer did speak with Patent attorney) She has contacted DSS and reported child neglect due to the patient IV Drug Use.  Axis I:  Opoid Abuse Axis II: Deferred Axis III:  Past Medical History  Diagnosis Date  . Asthma   . Anxiety    Axis IV: economic problems, occupational problems and problems related to social environment Axis V: 41-50 serious symptoms  Past Medical History:  Past Medical History  Diagnosis Date  . Asthma   . Anxiety     History reviewed. No pertinent past surgical history.  Family History: History reviewed. No pertinent family history.  Social History:  reports that she has been smoking Cigarettes.  She has a 2.5 pack-year smoking history. She does not have any smokeless tobacco history on file. She reports that she drinks alcohol. She reports that she uses illicit drugs (Marijuana).  Additional Social History:     CIWA: CIWA-Ar BP: 102/58 mmHg Pulse Rate: 79  Nausea and Vomiting: no nausea and no vomiting Tactile Disturbances: none Tremor: no  tremor Auditory Disturbances: not present Paroxysmal Sweats: no sweat visible Visual Disturbances: not present Anxiety: two Headache, Fullness in Head: moderate Agitation: two Orientation and Clouding of Sensorium: oriented and can do serial additions CIWA-Ar Total: 7  COWS:    Allergies: No Known Allergies  Home Medications:  Medications Prior to Admission  Medication Sig Dispense Refill  . albuterol (PROVENTIL HFA;VENTOLIN HFA) 108 (90 BASE) MCG/ACT inhaler Inhale 2 puffs into the lungs every 6 (six) hours as needed. For shortness of breath      . cyclobenzaprine (FLEXERIL) 10 MG tablet Take 10 mg by mouth at bedtime.        . diazepam (VALIUM) 5 MG tablet Take 5 mg by mouth 3 (three) times daily as needed. For anxiety       . etonogestrel (IMPLANON) 68 MG IMPL implant Inject 1 each into the skin once.      . Fluticasone-Salmeterol (ADVAIR) 500-50 MCG/DOSE AEPB Inhale 1 puff into the lungs every 12 (twelve) hours.        Marland Kitchen ibuprofen (ADVIL,MOTRIN) 200 MG tablet Take 800 mg by mouth 2 (two) times daily.          OB/GYN Status:  Patient's last menstrual period was 01/01/2012.  General Assessment Data Location of Assessment: [facility] (UNIT 300) ACT Assessment: Yes Living Arrangements: Spouse/significant other;Children Can pt return to current living arrangement?: Yes Admission Status: Voluntary Is patient capable of signing voluntary admission?: Yes Transfer from: Acute  Hospital Referral Source: Medical Floor Inpatient  Education Status Is patient currently in school?: No  Risk to self Suicidal Ideation: No Suicidal Intent: No Is patient at risk for suicide?: No Suicidal Plan?: No Access to Means: No What has been your use of drugs/alcohol within the last 12 months?: IV drug use Previous Attempts/Gestures: No (patient will not comment) How many times?: 0  Other Self Harm Risks: IV Drug use Triggers for Past Attempts: None known Intentional Self Injurious Behavior: None  (patient refuses to answer) Family Suicide History: Unknown Recent stressful life event(s):  (unknown-patient refuses to answer) Persecutory voices/beliefs?:  (refuses to answer) Depression: No (refused to answer) Substance abuse history and/or treatment for substance abuse?: Yes Suicide prevention information given to non-admitted patients: Yes (patient would not accept any assistance)  Risk to Others Homicidal Ideation: No Thoughts of Harm to Others: No Current Homicidal Intent: No Current Homicidal Plan: No History of harm to others?: No Assessment of Violence: None Noted Does patient have access to weapons?: No (refused to answer) Criminal Charges Pending?: No (refused to answer) Does patient have a court date: No (refused to ansswer)  Psychosis Hallucinations: None noted Delusions: None noted  Mental Status Report Appear/Hygiene: Disheveled Eye Contact: Poor (patient looked away and allowed significant other to respond) Motor Activity: Freedom of movement Speech: Argumentative;Elective mutism Level of Consciousness: Quiet/awake;Irritable Mood: Irritable;Ambivalent Affect: Irritable;Angry Anxiety Level: None Thought Processes: Coherent Judgement: Unimpaired Orientation: Unable to assess (refused to answer)  Cognitive Functioning Concentration: Normal IQ: Average Insight: Poor Impulse Control: Poor  ADLScreening [facility] Assessment Services) Patient's cognitive ability adequate to safely complete daily activities?: Yes Patient able to express need for assistance with ADLs?: Yes Independently performs ADLs?: Yes  Abuse/Neglect [facility]) Physical Abuse: Denies Verbal Abuse: Denies Sexual Abuse: Denies  Prior Inpatient Therapy Prior Inpatient Therapy:  (unknown)  Prior Outpatient Therapy Prior Outpatient Therapy:  (unknown)  ADL Screening (condition at time of admission) Patient's cognitive ability adequate to safely complete daily activities?: Yes Patient able to  express need for assistance with ADLs?: Yes Independently performs ADLs?: Yes Weakness of Legs: None Weakness of Arms/Hands: None  Home Assistive Devices/Equipment Home Assistive Devices/Equipment: None  Therapy Consults (therapy consults require a physician order) PT Evaluation Needed: No OT Evalulation Needed: No SLP Evaluation Needed: No Abuse/Neglect Assessment (Assessment to be complete while patient is alone) Physical Abuse: Denies Verbal Abuse: Denies Sexual Abuse: Denies Exploitation of patient/patient's resources: Denies Self-Neglect: Denies Values / Beliefs Cultural Requests During Hospitalization: None Spiritual Requests During Hospitalization: None Consults Spiritual Care Consult Needed: No Social Work Consult Needed: No Merchant navy officer (For Healthcare) Advance Directive: Patient does not have advance directive Pre-existing out of facility DNR order (yellow form or pink MOST form): No Nutrition Screen Diet: Vegetarian;Other (Comment) (will eat eggs and dairy) Unintentional weight loss greater than 10lbs within the last month: No Problems chewing or swallowing foods and/or liquids: No Home Tube Feeding or Total Parenteral Nutrition (TPN): No Patient appears severely malnourished: No Pregnant or Lactating: No  Additional Information 1:1 In Past 12 Months?:  (unknown) CIRT Risk: No Elopement Risk: No Does patient have medical clearance?: No     Disposition: Patient declined all services. She will be discharged when she is medically stable. Disposition Disposition of Patient: Other dispositions Other disposition(s):  (refused any referral to outpatient)  On Site Evaluation by:   Reviewed with Physician:     Jearld Pies 01/02/2012 11:26 AM

## 2012-01-02 NOTE — Clinical Social Work Note (Signed)
Consult requested by MD to assess current SA issues.  Patient w family (husband and grandparents) in room.  Asked if patient would talk privately w me, husband said "this is not a good time."  CSW spoke w patient about her discontent w current treatment in hospital, patient feels she needs IV antibiotics and says "that's only multivitamins" referring to IV bag.  Patient wants to leave hospital.  Patient has 27 year old son, [name], who is currently in care of patient's mother.  Patient's husband works at [employer] in [place], [place] and drove down last night to be with patient in hospital.  Due to family in room and lack of privacy, CSW did not complete SBIRT at this time.  Santa Genera C,LCSW Clinical Social Worker [phone])

## 2012-01-02 NOTE — Progress Notes (Signed)
UR Chart Review Completed  

## 2012-01-02 NOTE — Progress Notes (Signed)
Notified MD that pt threatened to leave AMA if we didn't let her use her inhaler. MD okayed the use of the inhaler as directed and to give medication ordered for anxiety.

## 2012-01-02 NOTE — ED Notes (Signed)
Report given to Clois Dupes, RN on ICU.

## 2012-01-02 NOTE — Clinical Social Work Psychosocial (Signed)
    Clinical Social Work Department BRIEF PSYCHOSOCIAL ASSESSMENT 01/02/2012  Patient:  Joy Herrera, Joy Herrera     Account Number:  [id number]     Admit date:  01/01/2012  Clinical Social Worker:  Santa Genera, CLINICAL SOCIAL WORKER  Date/Time:  01/02/2012 09:00 AM  Referred by:  Physician  Date Referred:  01/02/2012 Referred for  Substance Abuse   Other Referral:   Interview type:  Patient Other interview type:   Family at bedside, including husband and patient's grandparents.    PSYCHOSOCIAL DATA Living Status:  FAMILY Admitted from facility:   Level of care:   Primary support name:  Joy Herrera and Joy Herrera Primary support relationship to patient:  FAMILY Degree of support available:   Appear supportive.  Patient's mother and grandparents assist w care of 80 year old son, Joy Herrera.    CURRENT CONCERNS Current Concerns  Substance Abuse   Other Concerns:    SOCIAL WORK ASSESSMENT / PLAN Spoke w patient at bedside.  Family present, husband stated "this is not a good time" to talk privately w patient. Patient declined to talk about substance use issues at this time, but wanted to talk about dissatisfaction w current care at [facility].  Refuses to talk about substance use issues. Family states child, Joy Herrera (6), is cared for by grandmother and great grandparents as needed.  Spoke w attending, Elaina Pattee MD, about plan of care.  Plan is for ACT team to assess and refer for SA tx if indicated.  CPS report made due to presence of child in home w parent currently using substances.  CPS took report but stated that it was not an "emergency."  Would do in home assessment per their established protocol.   Assessment/plan status:  Psychosocial Support/Ongoing Assessment of Needs Other assessment/ plan:   Information/referral to community resources:   ACT team stated client declined information on SA treatment facilities.    PATIENT'S/FAMILY'S RESPONSE TO PLAN OF CARE: Patient and family  uncooperative and unwilling to accept resources offered.   Clovis Cao Clinical Social Worker [phone])

## 2012-01-02 NOTE — Progress Notes (Signed)
Patient refuses any assessment by nurse.

## 2012-01-02 NOTE — ED Notes (Signed)
Resting in bed on right side. Denies needs. No distress. Equal chest rise and fall, regular, unlabored. Sinus tach at 126 bpm. A&O x 4.

## 2012-01-02 NOTE — Plan of Care (Signed)
Problem: Consults Goal: General Medical Patient Education See Patient Education Module for specific education. Outcome: Completed/Met Date Met:  01/02/12 Educated pt and family member  Problem: Phase I Progression Outcomes Goal: OOB as tolerated unless otherwise ordered Outcome: Completed/Met Date Met:  01/02/12 OOB to [facility]

## 2012-01-02 NOTE — Progress Notes (Signed)
Subjective: This lady came in with symptoms related to intravenous abuse of OxyContin. She has a long history of prescription drug abuse and now she injects intravenously. She has a 27-year-old son. She was clearly not suicidal yesterday. She has no intention at the present time to discontinue her drug abuse.           Physical Exam: Blood pressure 102/58, pulse 79, temperature 100.7 F (38.2 C), temperature source Oral, resp. rate 13, height 5\' 4"  (1.626 m), weight 74.7 kg (164 lb 10.9 oz), last menstrual period 01/01/2012, SpO2 100.00%. She does look systemically well. She is not toxic or septic. Heart sounds are present without any murmurs whatsoever, especially no evidence of tricuspid regurgitation. She has no hallmarks of endocarditis clinically. Lung fields are clear. Abdomen is soft and nontender. There are no masses. She is alert and orientated. She is able to make her own medical decisions clinically.   Investigations:  Recent Results (from the past 240 hour(s))  CULTURE, BLOOD (ROUTINE X 2)     Status: Normal (Preliminary result)   Collection Time   01/02/12 12:49 AM      Component Value Range Status Comment   Specimen Description BLOOD LEFT HAND   Final    Special Requests BOTTLES DRAWN AEROBIC ONLY 4CC   Final    Culture PENDING   Incomplete    Report Status PENDING   Incomplete   CULTURE, BLOOD (ROUTINE X 2)     Status: Normal (Preliminary result)   Collection Time   01/02/12 12:49 AM      Component Value Range Status Comment   Specimen Description BLOOD LEFT ARM   Final    Special Requests BOTTLES DRAWN AEROBIC AND ANAEROBIC [facility]   Final    Culture PENDING   Incomplete    Report Status PENDING   Incomplete      Basic Metabolic Panel:  Basename 01/02/12 0339 01/01/12 2152  NA -- 135  K -- 2.9*  CL -- 102  CO2 -- 22  GLUCOSE -- 104*  BUN -- 8  CREATININE -- 0.71  CALCIUM -- 8.7  MG 1.4* --  PHOS -- --   Liver Function Tests:  [facility] 01/02/12 0339  01/01/12 2152  AST 33 32  ALT 24 24  ALKPHOS 43 42  BILITOT 0.3 0.3  PROT 6.3 6.3  ALBUMIN 3.3* 3.3*     CBC:  Basename 01/01/12 2152  WBC 15.7*  NEUTROABS 15.2*  HGB 11.7*  HCT 33.7*  MCV 90.8  PLT 270    Dg Chest Portable 1 View  01/02/2012  *RADIOLOGY REPORT*  Clinical Data: Shortness of breath.  Palpitations.  Drug overdose.  CHEST - 1 VIEW  Comparison:  None.  Findings: The heart size and mediastinal contours are within normal limits.  Both lungs are clear.  IMPRESSION: No active disease.  Original Report Authenticated By: Danae Orleans, M.D.      Medications: I have reviewed the patient's current medications.  Impression: 1. Intravenous opioid overdose in a intravenous drug abuser. 2. Possible sepsis. 3. Hypokalemia, status post intravenous potassium repletion.     Plan: 1. Continue with current therapy. 2. Act team consultation. 3. Await blood cultures to make sure there is no evidence of bacteremia.     LOS: 1 day   Wilson Singer Pager [phone]  01/02/2012, 7:24 AM

## 2012-01-02 NOTE — ED Notes (Signed)
Resting in bed on back. Pain 7\10 at this time in neck. Call bell within reach. A&O x 4. Will continue to monitor.

## 2012-01-03 DIAGNOSIS — F1110 Opioid abuse, uncomplicated: Secondary | ICD-10-CM

## 2012-01-03 DIAGNOSIS — A413 Sepsis due to Hemophilus influenzae: Secondary | ICD-10-CM

## 2012-01-03 DIAGNOSIS — E782 Mixed hyperlipidemia: Secondary | ICD-10-CM

## 2012-01-03 LAB — HEPATITIS PANEL, ACUTE
Hep B C IgM: NEGATIVE
Hepatitis B Surface Ag: NEGATIVE

## 2012-01-03 LAB — CBC
Hemoglobin: 12 g/dL (ref 12.0–15.0)
MCH: 31.3 pg (ref 26.0–34.0)
RBC: 3.84 MIL/uL — ABNORMAL LOW (ref 3.87–5.11)
WBC: 9 10*3/uL (ref 4.0–10.5)

## 2012-01-03 LAB — COMPREHENSIVE METABOLIC PANEL
ALT: 24 U/L (ref 0–35)
AST: 24 U/L (ref 0–37)
Albumin: 3.1 g/dL — ABNORMAL LOW (ref 3.5–5.2)
Alkaline Phosphatase: 42 U/L (ref 39–117)
CO2: 22 mEq/L (ref 19–32)
Chloride: 107 mEq/L (ref 96–112)
Creatinine, Ser: 0.64 mg/dL (ref 0.50–1.10)
GFR calc non Af Amer: >90 mL/min (ref >90)
Potassium: 3.4 mEq/L — ABNORMAL LOW (ref 3.5–5.1)
Total Bilirubin: 0.3 mg/dL (ref 0.3–1.2)

## 2012-01-03 LAB — SEDIMENTATION RATE: Sed Rate: 25 mm/hr — ABNORMAL HIGH (ref 0–22)

## 2012-01-03 MED ORDER — SODIUM CHLORIDE 0.9 % IJ SOLN
INTRAMUSCULAR | Status: AC
  Administered 2012-01-03: 13:00:00
  Filled 2012-01-03: qty 3

## 2012-01-03 NOTE — Progress Notes (Signed)
Patient refused telemetry monitor.  Notifed Dr. Selena Batten of patient's refusal to wear telemetry monitoring.  Explained the patient the importance of wearing monitor to monitor heart rate.  Patient still refused monitor.  Will continue to encourage patient to wear heart monitor.

## 2012-01-03 NOTE — Progress Notes (Signed)
Subjective: Patient says she does not feel well today. She does not describe what is bothering her.  She is sleeping on my arrival, but easily wakes up to voice.  She is awake, alert and appropriate, does not appear to be in any distresss  Objective: Vital signs in last 24 hours: Temp:  [98.3 F (36.8 C)-98.4 F (36.9 C)] 98.4 F (36.9 C) (06/20 0534) Pulse Rate:  [67-77] 67  (06/20 0534) Resp:  [18] 18  (06/20 0534) BP: (92-105)/(57-69) 105/69 mmHg (06/20 0534) SpO2:  [98 %-99 %] 98 % (06/20 0715) Weight change:  Last BM Date: 01/01/12  Intake/Output from previous day: 06/19 0701 - 06/20 0700 In: 460 [P.O.:300; I.V.:160] Out: -      Physical Exam: General: Alert, awake, oriented x3, in no acute distress. HEENT: No bruits, no goiter. Heart: Regular rate and rhythm, without murmurs, rubs, gallops. Lungs: Clear to auscultation bilaterally. Abdomen: Soft, nontender, nondistended, positive bowel sounds. Extremities: No clubbing cyanosis or edema with positive pedal pulses. Neuro: Grossly intact, nonfocal.    Lab Results: Basic Metabolic Panel:  Basename 01/03/12 0523 01/02/12 0339 01/01/12 2152  NA 139 -- 135  K 3.4* -- 2.9*  CL 107 -- 102  CO2 22 -- 22  GLUCOSE 98 -- 104*  BUN 3* -- 8  CREATININE 0.64 -- 0.71  CALCIUM 8.8 -- 8.7  MG -- 1.4* --  PHOS -- -- --   Liver Function Tests:  Basename 01/03/12 0523 01/02/12 0339  AST 24 33  ALT 24 24  ALKPHOS 42 43  BILITOT 0.3 0.3  PROT 6.2 6.3  ALBUMIN 3.1* 3.3*   No results found for this basename: LIPASE:2,AMYLASE:2 in the last 72 hours No results found for this basename: AMMONIA:2 in the last 72 hours CBC:  Basename 01/03/12 0523 01/01/12 2152  WBC 9.0 15.7*  NEUTROABS -- 15.2*  HGB 12.0 11.7*  HCT 35.2* 33.7*  MCV 91.7 90.8  PLT 294 270   Cardiac Enzymes: No results found for this basename: CKTOTAL:3,CKMB:3,CKMBINDEX:3,TROPONINI:3 in the last 72 hours BNP: No results found for this basename: PROBNP:3  in the last 72 hours D-Dimer: No results found for this basename: DDIMER:2 in the last 72 hours CBG: No results found for this basename: GLUCAP:6 in the last 72 hours Hemoglobin A1C: No results found for this basename: HGBA1C in the last 72 hours Fasting Lipid Panel: No results found for this basename: CHOL,HDL,LDLCALC,TRIG,CHOLHDL,LDLDIRECT in the last 72 hours Thyroid Function Tests:  Basename 01/02/12 0339  TSH 0.445  T4TOTAL --  FREET4 --  T3FREE --  THYROIDAB --   Anemia Panel: No results found for this basename: VITAMINB12,FOLATE,FERRITIN,TIBC,IRON,RETICCTPCT in the last 72 hours Coagulation: No results found for this basename: LABPROT:2,INR:2 in the last 72 hours Urine Drug Screen: Drugs of Abuse     Component Value Date/Time   LABOPIA NONE DETECTED 01/02/2012 0400   COCAINSCRNUR NONE DETECTED 01/02/2012 0400   LABBENZ NONE DETECTED 01/02/2012 0400   AMPHETMU NONE DETECTED 01/02/2012 0400   THCU NONE DETECTED 01/02/2012 0400   LABBARB NONE DETECTED 01/02/2012 0400    Alcohol Level:  Basename 01/01/12 2152  ETH <11   Urinalysis:  Basename 01/01/12 2243  COLORURINE YELLOW  LABSPEC 1.015  PHURINE 5.0  GLUCOSEU NEGATIVE  HGBUR TRACE*  BILIRUBINUR NEGATIVE  KETONESUR NEGATIVE  PROTEINUR NEGATIVE  UROBILINOGEN 0.2  NITRITE NEGATIVE  LEUKOCYTESUR SMALL*    Recent Results (from the past 240 hour(s))  CULTURE, BLOOD (ROUTINE X 2)     Status: Normal (  Preliminary result)   Collection Time   01/02/12 12:49 AM      Component Value Range Status Comment   Specimen Description BLOOD LEFT HAND   Final    Special Requests BOTTLES DRAWN AEROBIC ONLY 4CC   Final    Culture NO GROWTH 1 DAY   Final    Report Status PENDING   Incomplete   CULTURE, BLOOD (ROUTINE X 2)     Status: Normal (Preliminary result)   Collection Time   01/02/12 12:49 AM      Component Value Range Status Comment   Specimen Description BLOOD LEFT ARM   Final    Special Requests BOTTLES DRAWN AEROBIC  AND ANAEROBIC 4CC EACH   Final    Culture NO GROWTH 1 DAY   Final    Report Status PENDING   Incomplete   MRSA PCR SCREENING     Status: Normal   Collection Time   01/02/12  3:18 AM      Component Value Range Status Comment   MRSA by PCR NEGATIVE  NEGATIVE Final     Studies/Results: Dg Chest Portable 1 View  01/02/2012  *RADIOLOGY REPORT*  Clinical Data: Shortness of breath.  Palpitations.  Drug overdose.  CHEST - 1 VIEW  Comparison:  None.  Findings: The heart size and mediastinal contours are within normal limits.  Both lungs are clear.  IMPRESSION: No active disease.  Original Report Authenticated By: Danae Orleans, M.D.    Medications: Scheduled Meds:   . docusate sodium  100 mg Oral BID  . enoxaparin  40 mg Subcutaneous Q24H  . Fluticasone-Salmeterol  1 puff Inhalation Q12H  . piperacillin-tazobactam (ZOSYN)  IV  3.375 g Intravenous Q8H  . sodium chloride      . sodium chloride      . vancomycin  1,000 mg Intravenous Q8H   Continuous Infusions:   . sodium chloride    . sodium chloride 100 mL/hr at 01/02/12 1622   PRN Meds:.acetaminophen, acetaminophen, albuterol, alum & mag hydroxide-simeth, LORazepam, ondansetron (ZOFRAN) IV, ondansetron, senna-docusate  Assessment/Plan:  Active Problems:  Overdose  Sepsis  UTI (lower urinary tract infection)  Opiate addiction  Leukopenia  Hypokalemia  Anemia  Plan:  1. Fever, possibly related to UTI.  Unfortunately urine culture was not sent on admission. She has been on broad spectrum antibiotics.  Will treat for 7 days with antibiotics.  With her ongoing substance abuse, there was concern for bacteremia and sepsis.  Her cultures thus far have been negative.  If they show no growth for 48 hours (tomorrow), antibiotics can likely be de-escalated.  2. Substance abuse.  Patient seen by ACT team and social work and she was not interested in obtaining any assistance by available resources to assist in her abstaining from illicit drug  use.  3. Hypokalemia, improved  4. Social,  Since patient has a young child at home, with her ongoing substance abuse, a CPS referral was placed by social work.   Plan will likely be to discharge her home tomorrow   LOS: 2 days   Anuradha Chabot [facility] Pager: [phone] 01/03/2012, 11:42 AM

## 2012-01-03 NOTE — Care Management Note (Signed)
    Page 1 of 1   01/03/2012     11:45:26 AM   CARE MANAGEMENT NOTE 01/03/2012  Patient:  Joy Herrera, Joy Herrera   Account Number:  [id number]  Date Initiated:  01/03/2012  Documentation initiated by:  Rosemary Holms  Subjective/Objective Assessment:   Pt admitted after recreational drug abuse. CSW/ACT team involved.     Action/Plan:   Nuala Alpha with Medicaid's CW community service spoke with pt who agreed to their community service and will call her Monday to make an appointment to go see her.   Anticipated DC Date:  01/04/2012   Anticipated DC Plan:  HOME/SELF CARE  In-house referral  Clinical Social Worker      DC Planning Services  CM consult      Choice offered to / List presented to:             Status of service:  Completed, signed off Medicare Important Message given?   (If response is "NO", the following Medicare IM given date fields will be blank) Date Medicare IM given:   Date Additional Medicare IM given:    Discharge Disposition:  HOME/SELF CARE  Per UR Regulation:    If discussed at Long Length of Stay Meetings, dates discussed:    Comments:  01/03/12 1100 Dace Denn Leanord Hawking RN BSN CM

## 2012-01-03 NOTE — Clinical Social Work Note (Signed)
Phone call to CPS worker.  Worker investigated, interviewed patient and assessed child and family situation.  Child is now in care of grandmother and CPS will continue to monitor situation for safety.  CPS worker asked that we call w any additional concerns arise.  Patient to discharge home tomorrow, CSW signing off but will remain available for any additional needs that may arise.  Clovis Cao Clinical Social Worker [phone])

## 2012-01-03 NOTE — Clinical Social Work Note (Signed)
[place] DSS CPS investigator assigned to case.  [place] Houchins [phone]) interviewed patient at bedside yesterday and CSW Reubin Milan provided records he requested.  Houchins asked that I call him, left him VM today.  Clovis Cao Clinical Social Worker [phone])

## 2012-01-04 DIAGNOSIS — F1110 Opioid abuse, uncomplicated: Secondary | ICD-10-CM

## 2012-01-04 DIAGNOSIS — A413 Sepsis due to Hemophilus influenzae: Secondary | ICD-10-CM

## 2012-01-04 DIAGNOSIS — E782 Mixed hyperlipidemia: Secondary | ICD-10-CM

## 2012-01-04 MED ORDER — SULFAMETHOXAZOLE-TRIMETHOPRIM 800-160 MG PO TABS: 1.0000 | ORAL_TABLET | Freq: Two times a day (BID) | ORAL | Status: AC

## 2012-01-04 NOTE — Progress Notes (Signed)
Patient observed by tech walking form bathroom with no limp.

## 2012-01-04 NOTE — Progress Notes (Signed)
Pt discharged home today per Dr. Memon. Pt's IV site D/C'd and WNL. Pt's VS stable at this time. Pt provided with home medication list, discharge instructions and prescriptions. Pt verbalized understanding. Pt left floor in stable condition accompanied by RN.  

## 2012-01-04 NOTE — Progress Notes (Signed)
After discussing with charge nurse the situation of patient continually unplugging bed, a decision was made to move patient to camera room to be monitored.

## 2012-01-04 NOTE — Progress Notes (Signed)
Patient refused lab draws this am and still refuses to wear telemetry monitor.  Patient has displayed manipulative behaviors during the night towards family.  Patient is completely alert and oriented x 4 and is aware of her circumstances.  Will continue to monitor patient.

## 2012-01-04 NOTE — Progress Notes (Signed)
Dr. Kerry Hough made aware of pt's refusal to have labs drawn this morning including Vanc Trough. IV Vanc not given at this time. Will continue to monitor.

## 2012-01-04 NOTE — Discharge Summary (Addendum)
Physician Discharge Summary  Patient ID: Joy Herrera MRN: [id number] DOB/AGE: [birth year] 26 y.o.  Admit date: 01/01/2012 Discharge date: 01/04/2012  Primary Care Physician:  No primary provider on file.   Discharge Diagnoses:    Active Problems:  Sepsis  UTI (lower urinary tract infection)  Opiate addiction  Leukopenia  Hypokalemia  Anemia    Medication List  As of 01/04/2012 11:08 AM   TAKE these medications         albuterol 108 (90 BASE) MCG/ACT inhaler   Commonly known as: PROVENTIL HFA;VENTOLIN HFA   Inhale 2 puffs into the lungs every 6 (six) hours as needed. For shortness of breath      cyclobenzaprine 10 MG tablet   Commonly known as: FLEXERIL   Take 10 mg by mouth at bedtime.      diazepam 5 MG tablet   Commonly known as: VALIUM   Take 5 mg by mouth 3 (three) times daily as needed. For anxiety      Fluticasone-Salmeterol 500-50 MCG/DOSE Aepb   Commonly known as: ADVAIR   Inhale 1 puff into the lungs every 12 (twelve) hours.      ibuprofen 200 MG tablet   Commonly known as: ADVIL,MOTRIN   Take 800 mg by mouth 2 (two) times daily.      IMPLANON 68 MG Impl implant   Generic drug: etonogestrel   Inject 1 each into the skin once.      sulfamethoxazole-trimethoprim 800-160 MG per tablet   Commonly known as: BACTRIM DS,SEPTRA DS   Take 1 tablet by mouth 2 (two) times daily.           Discharge Exam: Blood pressure 111/77, pulse 82, temperature 98.4 F (36.9 C), temperature source Oral, resp. rate 16, height 5\' 4"  (1.626 m), weight 74.7 kg (164 lb 10.9 oz), last menstrual period 01/01/2012, SpO2 95.00%. NAD CTA B S1, S2, RRR Soft, NT, BS+ No edema b/l  Disposition and Follow-up:  Discharge home and follow up with a primary doctor  Consults:  none   Significant Diagnostic Studies:  No results found.  Brief H and P: For complete details please refer to admission H and P, but in brief Joy Herrera is a 26 year old woman with a past medical  history significant for addictive disease and asthma who presented to the emergency department his evening after developing palpitations, chills and weakness following the injection of 80 mg of OxyContin that she obtained a illegally. He has a long history of illegal drug use that started in her teenage years which includes crack cocaine abuse, opiate abuse, and benzodiazepine abuse. She reports being crack cocaine free for the past 7 years but had a long and difficult recovery from that addiction. According to her mother, the patient weighed 98 lbs when she stopped using crack. For the past several years the patient has been abusing prescription opiates. She reports "eating them by the dozens" and not getting any effect, then moved to "snorting them", when this no longer became effective for her "high" she started crushing the pills and dissolving them to inject. Typically she reports boiling the water she uses for injection but this evening she crushed and dissolved the oxycontin and drew it up through a piece of cotton without heating it. She injects at least once a week but also reports injecting several days in a row. She obtained the "roxies" off the street but says they appeared to be untampered. She uses clean disposable needles and does not share  needles. Other than occasional mild asthma symptoms she reports being in good health and leads a fairly normal life despite her severe addiction.  Currently she feels palpitations in her chest and intense anxiety, but no chest pain or pressure. She has fever and chills and feel very weak. No cough or URI symptoms. She complains of supra-pubic pain and dysuria. No HA or other neurological complaints.   Hospital Course:  This lady was admitted to the hospital with fever, chills, weakness and supra pubic pain.  She was found to have a urinary tract infection and was started on antibiotics. She has a history of ongoing IV drug abuse.  Blood cultures were drawn on  admission and have shown no growth.  She has remained afebrile throughout the remainder of her hospital course.  Her leukocytosis has resolved.  She will be transitioned to PO abx to complete the course.  During her hospital stay, she was noted to be very aggressive, and verbally abusive to her family and staff.  Patient has an opiod addiction.  She was seen by both social work and ACT team.  She declined any available resources. She does have a young son at home.  With her ongoing substance abuse, a referral to child protective services was made.  Time spent on Discharge:  Signed: Graycee Greeson [facility] Pager: [phone] 01/04/2012, 11:08 AM

## 2012-01-04 NOTE — Progress Notes (Signed)
Went back in room to check on patient and she had once again unplugged bed as she had been doing all day.

## 2012-01-04 NOTE — Discharge Instructions (Signed)

## 2012-01-04 NOTE — Progress Notes (Signed)
As I was coming from break room secretary notified me that patient in 80 had stated she fell. Dr. Kerry Hough was coming from dictation room and I told him what had happened and he stated that she was just using a tactic to obtain pain meds. I proceeded onto patients room and she was sitting on bed with tech present Annamaria Helling). Tech stated she was sitting there when she got to room. Patient stated that she slipped in some water and fell and hurt her foot. I assessed patient and saw no redness or discoloration to area. Patient was able to move foot fine. Patient stated that she had to limp back to bed. Housekeeping was called to mop floor, but did not notice any water. Bed alarm was turned on again.

## 2012-01-07 LAB — CULTURE, BLOOD (ROUTINE X 2)

## 2012-01-16 ENCOUNTER — Emergency Department (HOSPITAL_COMMUNITY)
Admission: EM | Admit: 2012-01-16 | Discharge: 2012-01-16 | Disposition: A | Discharge status: Home / Self Care | Payer: Medicaid Other

## 2012-01-16 NOTE — ED Notes (Signed)
No answer

## 2012-01-16 NOTE — ED Notes (Signed)
No answer, called x 2

## 2012-03-12 ENCOUNTER — Encounter (HOSPITAL_COMMUNITY): Payer: Self-pay

## 2012-03-12 ENCOUNTER — Emergency Department (HOSPITAL_COMMUNITY)
Admission: EM | Admit: 2012-03-12 | Discharge: 2012-03-12 | Disposition: A | Discharge status: Home / Self Care | Payer: Medicaid Other | Attending: Emergency Medicine | Admitting: Emergency Medicine

## 2012-03-12 DIAGNOSIS — F411 Generalized anxiety disorder: Secondary | ICD-10-CM | POA: Insufficient documentation

## 2012-03-12 DIAGNOSIS — J45909 Unspecified asthma, uncomplicated: Secondary | ICD-10-CM | POA: Insufficient documentation

## 2012-03-12 DIAGNOSIS — Z046 Encounter for general psychiatric examination, requested by authority: Secondary | ICD-10-CM | POA: Insufficient documentation

## 2012-03-12 DIAGNOSIS — Z139 Encounter for screening, unspecified: Secondary | ICD-10-CM

## 2012-03-12 DIAGNOSIS — F17200 Nicotine dependence, unspecified, uncomplicated: Secondary | ICD-10-CM | POA: Insufficient documentation

## 2012-03-12 LAB — CBC
HCT: 36.4 % (ref 36.0–46.0)
Hemoglobin: 13 g/dL (ref 12.0–15.0)
MCV: 88.8 fL (ref 78.0–100.0)
RBC: 4.1 MIL/uL (ref 3.87–5.11)
RDW: 12.2 % (ref 11.5–15.5)
WBC: 6.2 10*3/uL (ref 4.0–10.5)

## 2012-03-12 LAB — RAPID URINE DRUG SCREEN, HOSP PERFORMED
Benzodiazepines: NOT DETECTED
Cocaine: NOT DETECTED
Tetrahydrocannabinol: NOT DETECTED

## 2012-03-12 LAB — BASIC METABOLIC PANEL
CO2: 23 mEq/L (ref 19–32)
Chloride: 103 mEq/L (ref 96–112)
GFR calc Af Amer: >90 mL/min (ref >90)
Potassium: 3.6 mEq/L (ref 3.5–5.1)
Sodium: 136 mEq/L (ref 135–145)

## 2012-03-12 LAB — PREGNANCY, URINE: Preg Test, Ur: NEGATIVE

## 2012-03-12 NOTE — BH Assessment (Signed)
Assessment Note   Joy Herrera is an 26 y.o. female. PT PRESENTED TO THE ED BY [place]O ON IVC TAKEN OUT BY HER MOTHER FOR SUBSTANCE ABUSE. PT UDS WAS CLEAN. SHE REPORTS ARGUMENTS WITH HER MOTHER OVER HER SON AS HER MOTHER  IS WANTING TO KEEP HER SON. SHE HAD ALLOWED HER TO KEEP HIM THIS SUMMER AND GOT UPSET WITH HER WHEN SHE DID NOT RETURN HIM. SHE THEN TOOK OUT PAPERS. PT DENIES S/I, H/I AND IS NOT PSYCHOTIC. PETITION IS RESCINDED BY DR [facility].       Axis I: Mood Disorder NOS Axis II: Deferred Axis III:  Past Medical History  Diagnosis Date  . Asthma   . Anxiety    Axis IV: other psychosocial or environmental problems and problems with primary support group Axis V: 51-60 moderate symptoms  Past Medical History:  Past Medical History  Diagnosis Date  . Asthma   . Anxiety     History reviewed. No pertinent past surgical history.  Family History: No family history on file.  Social History:  reports that she has been smoking Cigarettes.  She has a 2.5 pack-year smoking history. She does not have any smokeless tobacco history on file. She reports that she drinks alcohol. She reports that she uses illicit drugs (Marijuana).  Additional Social History:  Alcohol / Drug Use Pain Medications: NA Prescriptions: NA Over the Counter: NA History of alcohol / drug use?: No history of alcohol / drug abuse  CIWA: CIWA-Ar BP: 136/87 mmHg Pulse Rate: 98  COWS:    Allergies: No Known Allergies  Home Medications:  (Not in a hospital admission)  OB/GYN Status:  Patient's last menstrual period was 02/27/2012.  General Assessment Data Location of Assessment: [facility] ACT Assessment: Yes Living Arrangements: Spouse/significant other Can pt return to current living arrangement?: Yes Admission Status: Involuntary Is patient capable of signing voluntary admission?: No Transfer from: Acute Hospital Referral Source: MD     Risk to self Suicidal Ideation: No Suicidal  Intent: No Is patient at risk for suicide?: No Suicidal Plan?: No Access to Means: No What has been your use of drugs/alcohol within the last 12 months?: NA Previous Attempts/Gestures: No How many times?: 0  Other Self Harm Risks: NA Triggers for Past Attempts: None known Intentional Self Injurious Behavior: None Family Suicide History: No Recent stressful life event(s): Conflict (Comment) (WITH MOTHER) Persecutory voices/beliefs?: No Depression: No Substance abuse history and/or treatment for substance abuse?: No Suicide prevention information given to non-admitted patients: Not applicable  Risk to Others Homicidal Ideation: No Thoughts of Harm to Others: No Current Homicidal Intent: No Current Homicidal Plan: No Access to Homicidal Means: No History of harm to others?: No Assessment of Violence: None Noted Does patient have access to weapons?: No Criminal Charges Pending?: No Does patient have a court date: No  Psychosis Hallucinations: None noted Delusions: None noted  Mental Status Report Appear/Hygiene: Improved Eye Contact: Good Motor Activity: Freedom of movement Speech: Logical/coherent Level of Consciousness: Alert Mood: Despair Affect: Appropriate to circumstance Anxiety Level: Minimal Thought Processes: Coherent;Relevant Judgement: Unimpaired Orientation: Person;Place;Time;Situation Obsessive Compulsive Thoughts/Behaviors: None  Cognitive Functioning Concentration: Normal Memory: Recent Intact;Remote Intact IQ: Average Insight: Good Impulse Control: Good Appetite: Good Sleep: No Change Total Hours of Sleep: 8  Vegetative Symptoms: None  ADLScreening [facility] Assessment Services) Patient's cognitive ability adequate to safely complete daily activities?: Yes Patient able to express need for assistance with ADLs?: Yes Independently performs ADLs?: Yes (appropriate for developmental age)  Abuse/Neglect [place]) Physical Abuse: Denies Verbal Abuse:  Denies Sexual Abuse: Denies  Prior Inpatient Therapy Prior Inpatient Therapy: No  Prior Outpatient Therapy Prior Outpatient Therapy: No  ADL Screening (condition at time of admission) Patient's cognitive ability adequate to safely complete daily activities?: Yes Patient able to express need for assistance with ADLs?: Yes Independently performs ADLs?: Yes (appropriate for developmental age) Weakness of Legs: None Weakness of Arms/Hands: None  Home Assistive Devices/Equipment Home Assistive Devices/Equipment: None  Therapy Consults (therapy consults require a physician order) OT Evalulation Needed: No SLP Evaluation Needed: No Abuse/Neglect Assessment (Assessment to be complete while patient is alone) Physical Abuse: Denies Verbal Abuse: Denies Sexual Abuse: Denies Exploitation of patient/patient's resources: Denies Self-Neglect: Denies Values / Beliefs Cultural Requests During Hospitalization: None Spiritual Requests During Hospitalization: None Consults Spiritual Care Consult Needed: No Social Work Consult Needed: No Merchant navy officer (For Healthcare) Advance Directive: Patient does not have advance directive;Patient has advance directive, copy in chart Pre-existing out of facility DNR order (yellow form or pink MOST form): No    Additional Information 1:1 In Past 12 Months?: No CIRT Risk: No Elopement Risk: No Does patient have medical clearance?: Yes     Disposition: D/C HOME  PETITION RESCINDED Disposition Disposition of Patient: Other dispositions Other disposition(s): Other (Comment) (PETITION RESCINDED D/C HOME)  On Site Evaluation by:   Reviewed with Physician:  DR Nicholos Johns MC Georgeann Oppenheim Winford 03/12/2012 11:09 PM

## 2012-03-12 NOTE — ED Notes (Signed)
Samara Deist with Act team in talking / evaluating the patient

## 2012-03-12 NOTE — ED Provider Notes (Signed)
History     CSN: [id number]  Arrival date & time 03/12/12  1429   First MD Initiated Contact with Patient 03/12/12 1501      Chief Complaint  Patient presents with  . V70.1    HPI Pt was seen at 1520.  Per pt and Police, pt's mother took out IVC paperwork this morning stating pt has been "abusing heroin" and "violent."  Pt states she was supposed to go to court today for a custody hearing for her children, but was picked up by the Police with IVC paperwork. Pt states she has a previous hx of substance abuse, but has been "clean" (brought a copy of negative UDS taken at OSH on 03/11/12).  Pt states she has been in counseling at [facility] and taking parenting classes.  Denies SI, no SA, no HI.      Past Medical History  Diagnosis Date  . Asthma   . Anxiety     History reviewed. No pertinent past surgical history.   History  Substance Use Topics  . Smoking status: Current Everyday Smoker -- 0.2 packs/day for 10 years    Types: Cigarettes  . Smokeless tobacco: Not on file  . Alcohol Use: Yes     occasionally    Review of Systems ROS: Statement: All systems negative except as marked or noted in the HPI; Constitutional: Negative for fever and chills. ; ; Eyes: Negative for eye pain, redness and discharge. ; ; ENMT: Negative for ear pain, hoarseness, nasal congestion, sinus pressure and sore throat. ; ; Cardiovascular: Negative for chest pain, palpitations, diaphoresis, dyspnea and peripheral edema. ; ; Respiratory: Negative for cough, wheezing and stridor. ; ; Gastrointestinal: Negative for nausea, vomiting, diarrhea, abdominal pain, blood in stool, hematemesis, jaundice and rectal bleeding. . ; ; Genitourinary: Negative for dysuria, flank pain and hematuria. ; ; Musculoskeletal: Negative for back pain and neck pain. Negative for swelling and trauma.; ; Skin: Negative for pruritus, rash, abrasions, blisters, bruising and skin lesion.; ; Neuro: Negative for headache, lightheadedness and  neck stiffness. Negative for weakness, altered level of consciousness , altered mental status, extremity weakness, paresthesias, involuntary movement, seizure and syncope.; Psych:  No SI, no SA, no HI, no hallucinations.      Allergies  Review of patient's allergies indicates no known allergies.  Home Medications   Current Outpatient Rx  Name Route Sig Dispense Refill  . ALBUTEROL SULFATE HFA 108 (90 BASE) MCG/ACT IN AERS Inhalation Inhale 2 puffs into the lungs every 6 (six) hours as needed. For shortness of breath    . CYCLOBENZAPRINE HCL 10 MG PO TABS Oral Take 10 mg by mouth at bedtime.      Marland Kitchen FLUTICASONE-SALMETEROL 500-50 MCG/DOSE IN AEPB Inhalation Inhale 1 puff into the lungs every 12 (twelve) hours.      . IBUPROFEN 200 MG PO TABS Oral Take 800 mg by mouth 2 (two) times daily.        BP 136/87  Pulse 98  Temp 98.4 F (36.9 C) (Oral)  Resp 18  Ht 5\' 4"  (1.626 m)  Wt 155 lb (70.308 kg)  BMI 26.61 kg/m2  SpO2 100%  LMP 02/27/2012  Physical Exam 1525: Physical examination:  Nursing notes reviewed; Vital signs and O2 SAT reviewed;  Constitutional: Well developed, Well nourished, Well hydrated, In no acute distress; Head:  Normocephalic, atraumatic; Eyes: EOMI, PERRL, No scleral icterus; ENMT: Mouth and pharynx normal, Mucous membranes moist; Neck: Supple, Full range of motion, No lymphadenopathy; Cardiovascular: Regular  rate and rhythm, No murmur, rub, or gallop; Respiratory: Breath sounds clear & equal bilaterally, No rales, rhonchi, wheezes.  Speaking full sentences with ease, Normal respiratory effort/excursion; Chest: Nontender, Movement normal;; Extremities: Pulses normal, No tenderness, No edema, No calf edema or asymmetry.; Neuro: AA&Ox3, Major CN grossly intact.  Speech clear. No gross focal motor or sensory deficits in extremities.; Skin: Color normal, Warm, Dry.; Psych:  Affect flat, poor eye contact. Denies SI.     ED Course  Procedures   1530:  ED RN states when  they accompanied pt to the bathroom for urine sample, pt apparently had wet underclothing and saran wrap was in the toilet.  ED Tech will perform in and out cath for urine.   2000:  ACT Samson Frederic has eval:  Pt continues to deny SI, no HI, no psychosis.  No criteria to admit, IVC paperwork rescinded.  Will go home with family.    MDM  MDM Reviewed: nursing note, vitals and previous chart Interpretation: labs     Results for orders placed during the hospital encounter of 03/12/12  BASIC METABOLIC PANEL      Component Value Range   Sodium 136  135 - 145 mEq/L   Potassium 3.6  3.5 - 5.1 mEq/L   Chloride 103  96 - 112 mEq/L   CO2 23  19 - 32 mEq/L   Glucose, Bld 113 (*) 70 - 99 mg/dL   BUN 7  6 - 23 mg/dL   Creatinine, Ser 8.65  0.50 - 1.10 mg/dL   Calcium 9.0  8.4 - 78.4 mg/dL   GFR calc non Af Amer >90  >90 mL/min   GFR calc Af Amer >90  >90 mL/min  CBC      Component Value Range   WBC 6.2  4.0 - 10.5 K/uL   RBC 4.10  3.87 - 5.11 MIL/uL   Hemoglobin 13.0  12.0 - 15.0 g/dL   HCT 69.6  29.5 - 28.4 %   MCV 88.8  78.0 - 100.0 fL   MCH 31.7  26.0 - 34.0 pg   MCHC 35.7  30.0 - 36.0 g/dL   RDW 13.2  44.0 - 10.2 %   Platelets 338  150 - 400 K/uL  URINE RAPID DRUG SCREEN (HOSP PERFORMED)      Component Value Range   Opiates NONE DETECTED  NONE DETECTED   Cocaine NONE DETECTED  NONE DETECTED   Benzodiazepines NONE DETECTED  NONE DETECTED   Amphetamines NONE DETECTED  NONE DETECTED   Tetrahydrocannabinol NONE DETECTED  NONE DETECTED   Barbiturates NONE DETECTED  NONE DETECTED  ETHANOL      Component Value Range   Alcohol, Ethyl (B) <11  0 - 11 mg/dL  PREGNANCY, URINE      Component Value Range   Preg Test, Ur NEGATIVE  NEGATIVE            Laray Anger, DO 03/14/12 1230

## 2012-03-12 NOTE — ED Notes (Signed)
Pt was assisted to the restroom to get urine and when pt got off the toilet the bowl had saran wrap in it and pt's underwear and pad was wet. Pt did not get successful urine. Will do an in and out.

## 2012-03-12 NOTE — ED Notes (Signed)
Advised per Samara Deist and MD patient ready for discharge.  Patient and spouse  Verbalized understanding of follow up appointments with [facility] and MD as written

## 2012-03-12 NOTE — ED Notes (Signed)
Pt crying at times and is upset that her husband can not come back to see her.

## 2012-03-12 NOTE — ED Notes (Signed)
Pt denies SI or HI .  

## 2012-03-12 NOTE — ED Notes (Signed)
Pt presents to the ED with [employer].  Reports pt was supposed to be in court today for child custody hearing but before court mother took out involuntary commitment papers.  Pt says has history of substance abuse and has been in counseling at [facility] and has been taking parenting classes.  Pt has copy of drug test that was taken on 8/27.

## 2012-03-12 NOTE — ED Notes (Signed)
Report received from Nilda Simmer, RN.  [employer] deputy outside of the room for observation - ACT Team member has been consulted to evaluate patient.

## 2012-09-02 IMAGING — CR DG CHEST 1V PORT
1 series · 1 of 1 positions shown · non-contrast
Comparison: None.

CLINICAL DATA: Shortness of breath.  Palpitations.  Drug overdose.

CHEST - 1 VIEW

[view not recorded]
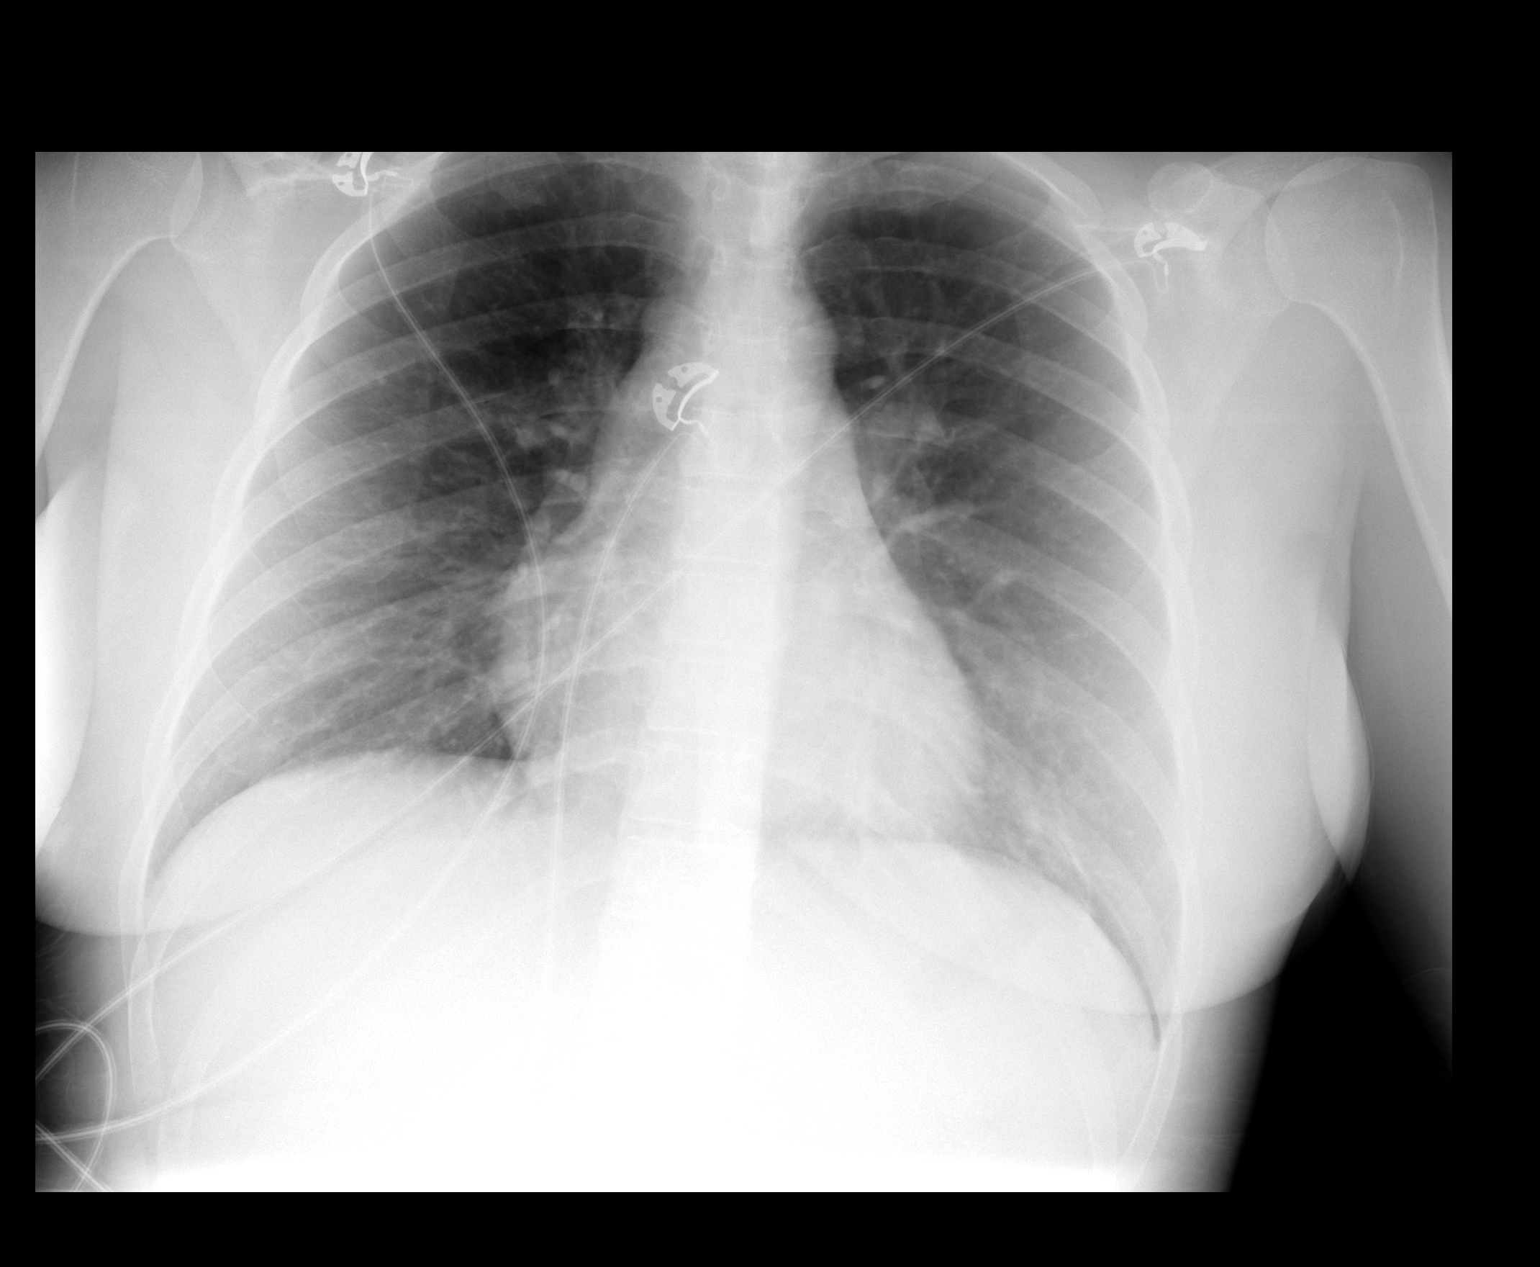

[1 of 1 positions shown; findings below may reference images not displayed]

FINDINGS: The heart size and mediastinal contours are within normal
limits.  Both lungs are clear.
IMPRESSION: No active disease.

## 2015-07-17 NOTE — L&D Delivery Note (Signed)
Delivery Note Patient arrived on the unit with fetal head crowning. At 0840 a viable femal sex was delivered via  (Presentation: LOA).  APGAR: 9,9 ; weight pending  . Patient given 10IM of pitocin prior to delivery of the placenta. Placenta status: 0847 complete, intact .  Cord:  with the following complications: precipitous delivery.  Cord pH: NA  Anesthesia: none   Episiotomy:  None  Lacerations:  None  Suture Repair: NA Est. Blood Loss (mL):  250mL  Mom to postpartum.  Baby to Couplet care / Skin to Skin.  Tawnya CrookHogan, Jadalynn Burr Donovan 11/17/2015, 8:52 AM

## 2015-11-16 ENCOUNTER — Inpatient Hospital Stay (HOSPITAL_COMMUNITY)
Admission: AD | Admit: 2015-11-16 | Discharge: 2015-11-16 | Disposition: A | Discharge status: Home / Self Care | Payer: Medicaid Other | Source: Ambulatory Visit | Attending: Family Medicine | Admitting: Family Medicine

## 2015-11-16 ENCOUNTER — Inpatient Hospital Stay (HOSPITAL_COMMUNITY): Payer: Medicaid Other

## 2015-11-16 ENCOUNTER — Encounter (HOSPITAL_COMMUNITY): Payer: Self-pay | Admitting: *Deleted

## 2015-11-16 DIAGNOSIS — Z79899 Other long term (current) drug therapy: Secondary | ICD-10-CM | POA: Insufficient documentation

## 2015-11-16 DIAGNOSIS — O4703 False labor before 37 completed weeks of gestation, third trimester: Secondary | ICD-10-CM | POA: Insufficient documentation

## 2015-11-16 DIAGNOSIS — O99320 Drug use complicating pregnancy, unspecified trimester: Secondary | ICD-10-CM

## 2015-11-16 DIAGNOSIS — O0933 Supervision of pregnancy with insufficient antenatal care, third trimester: Secondary | ICD-10-CM | POA: Insufficient documentation

## 2015-11-16 DIAGNOSIS — Z87898 Personal history of other specified conditions: Secondary | ICD-10-CM | POA: Insufficient documentation

## 2015-11-16 DIAGNOSIS — F17210 Nicotine dependence, cigarettes, uncomplicated: Secondary | ICD-10-CM | POA: Insufficient documentation

## 2015-11-16 DIAGNOSIS — F1110 Opioid abuse, uncomplicated: Secondary | ICD-10-CM | POA: Diagnosis present

## 2015-11-16 DIAGNOSIS — Z3A33 33 weeks gestation of pregnancy: Secondary | ICD-10-CM | POA: Insufficient documentation

## 2015-11-16 DIAGNOSIS — Z8759 Personal history of other complications of pregnancy, childbirth and the puerperium: Secondary | ICD-10-CM

## 2015-11-16 DIAGNOSIS — O99333 Smoking (tobacco) complicating pregnancy, third trimester: Secondary | ICD-10-CM | POA: Insufficient documentation

## 2015-11-16 DIAGNOSIS — O0930 Supervision of pregnancy with insufficient antenatal care, unspecified trimester: Secondary | ICD-10-CM

## 2015-11-16 DIAGNOSIS — F1120 Opioid dependence, uncomplicated: Secondary | ICD-10-CM

## 2015-11-16 LAB — DIFFERENTIAL
BASOS PCT: 0 %
Basophils Absolute: 0 10*3/uL (ref 0.0–0.1)
EOS ABS: 0.1 10*3/uL (ref 0.0–0.7)
Eosinophils Relative: 1 %
Lymphocytes Relative: 12 %
Lymphs Abs: 1.9 10*3/uL (ref 0.7–4.0)
MONOS PCT: 4 %
Monocytes Absolute: 0.7 10*3/uL (ref 0.1–1.0)
Neutro Abs: 12.5 10*3/uL — ABNORMAL HIGH (ref 1.7–7.7)
Neutrophils Relative %: 83 %

## 2015-11-16 LAB — TYPE AND SCREEN
ABO/RH(D): A POS
Antibody Screen: NEGATIVE

## 2015-11-16 LAB — RAPID URINE DRUG SCREEN, HOSP PERFORMED
AMPHETAMINES: NOT DETECTED
BENZODIAZEPINES: NOT DETECTED
Barbiturates: NOT DETECTED
COCAINE: NOT DETECTED
Opiates: POSITIVE — AB
Tetrahydrocannabinol: NOT DETECTED

## 2015-11-16 LAB — URINALYSIS, ROUTINE W REFLEX MICROSCOPIC
BILIRUBIN URINE: NEGATIVE
GLUCOSE, UA: NEGATIVE mg/dL
Ketones, ur: NEGATIVE mg/dL
Nitrite: NEGATIVE
PH: 7 (ref 5.0–8.0)
Protein, ur: 100 mg/dL — AB
SPECIFIC GRAVITY, URINE: 1.02 (ref 1.005–1.030)

## 2015-11-16 LAB — CBC
HEMATOCRIT: 31.7 % — AB (ref 36.0–46.0)
Hemoglobin: 10.3 g/dL — ABNORMAL LOW (ref 12.0–15.0)
MCH: 25.6 pg — AB (ref 26.0–34.0)
MCHC: 32.5 g/dL (ref 30.0–36.0)
MCV: 78.7 fL (ref 78.0–100.0)
Platelets: 374 10*3/uL (ref 150–400)
RBC: 4.03 MIL/uL (ref 3.87–5.11)
RDW: 15 % (ref 11.5–15.5)
WBC: 15.1 10*3/uL — ABNORMAL HIGH (ref 4.0–10.5)

## 2015-11-16 LAB — RPR: RPR Ser Ql: NONREACTIVE

## 2015-11-16 LAB — HIV ANTIBODY (ROUTINE TESTING W REFLEX): HIV Screen 4th Generation wRfx: NONREACTIVE

## 2015-11-16 LAB — URINE MICROSCOPIC-ADD ON

## 2015-11-16 LAB — ABO/RH: ABO/RH(D): A POS

## 2015-11-16 LAB — HEPATITIS B SURFACE ANTIGEN: Hepatitis B Surface Ag: NEGATIVE

## 2015-11-16 MED ORDER — OXYCODONE-ACETAMINOPHEN 5-325 MG PO TABS: 2.0000 | ORAL_TABLET | Freq: Once | ORAL | Status: DC

## 2015-11-16 MED ORDER — FENTANYL CITRATE (PF) 100 MCG/2ML IJ SOLN
50.0000 ug | Freq: Once | INTRAMUSCULAR | Status: AC
  Administered 2015-11-16: 50 ug via INTRAMUSCULAR
  Filled 2015-11-16: qty 2

## 2015-11-16 MED ORDER — ZOLPIDEM TARTRATE 10 MG PO TABS: 10.0000 mg | ORAL_TABLET | Freq: Every evening | ORAL | Status: DC | PRN

## 2015-11-16 MED ORDER — HYDROXYZINE HCL 50 MG/ML IM SOLN
25.0000 mg | Freq: Once | INTRAMUSCULAR | Status: AC
  Administered 2015-11-16: 25 mg via INTRAMUSCULAR
  Filled 2015-11-16: qty 0.5

## 2015-11-16 NOTE — MAU Note (Signed)
PT  SAYS  NO PNC-     STARTED HURTING    AT 0500     .  NO  BLEEDING  , NO SROM        DENIES HSV AND MRSA.      SAYS IS    RECOVERY  FROM    ADDICTION  OF  OXYCODONE-  TAKES  SUBUTEX.    8 MG  BID    -  TOOK LAST NIGHT.  [facility]

## 2015-11-16 NOTE — MAU Note (Signed)
Urine in lab 

## 2015-11-16 NOTE — MAU Provider Note (Signed)
Chief Complaint:  Contractions   First Provider Initiated Contact with Patient 11/16/15 0818     HPI  Joy Herrera is a 30 y.o. G4P0 at [id number] presents to maternity admissions reporting contractions since 0500.  Denies leaking or bleeding.  RN verified appt is at NICU and not clinic.  No prenatal care.  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  Earlier notes dictate other substance use including MJ and valium, dating back to 2005. She reports good fetal movement, denies LOF, vaginal bleeding, vaginal itching/burning, urinary symptoms, h/a, dizziness, n/v, diarrhea, constipation or fever/chills.  She denies headache, visual changes or RUQ abdominal pain.  RN Note: PT SAYS NO PNC- STARTED HURTING AT 0500 . NO BLEEDING , NO SROM DENIES HSV AND MRSA. SAYS IS RECOVERY FROM ADDICTION OF OXYCODONE- TAKES SUBUTEX. 8 MG BID - TOOK LAST NIGHT. HAS [facility] AT [facility].          Past Medical History: Past Medical History  Diagnosis Date  . Asthma   . Anxiety     Past obstetric history: OB History  Gravida Para Term Preterm AB SAB TAB Ectopic Multiple Living  4         1    # Outcome Date GA Lbr Len/2nd Weight Sex Delivery Anes PTL Lv  4 Current           3 Gravida           2 Gravida      Vag-Spont     1 Gravida      Vag-Spont   FD      Past Surgical History: History reviewed. No pertinent past surgical history.  Family History: History reviewed. No pertinent family history.  Social History: Social History  Substance Use Topics  . Smoking status: Current Every Day Smoker -- 0.25 packs/day for 10 years    Types: Cigarettes  . Smokeless tobacco: None  . Alcohol Use: Yes     Comment: occasionally    Allergies: No Known Allergies  Meds:  Prescriptions prior to admission  Medication Sig Dispense Refill Last Dose  . buprenorphine (SUBUTEX) 2 MG SUBL SL  tablet Place 8 mg under the tongue 2 (two) times daily.    11/15/2015 at Unknown time  . calcium carbonate (TUMS - DOSED IN MG ELEMENTAL CALCIUM) 500 MG chewable tablet Chew 1 tablet by mouth daily.   11/15/2015 at Unknown time    I have reviewed patient's Past Medical Hx, Surgical Hx, Family Hx, Social Hx, medications and allergies.   ROS:  Review of Systems  Constitutional: Negative for fever and chills.  Respiratory: Negative for shortness of breath.   Gastrointestinal: Positive for abdominal pain. Negative for nausea, vomiting and diarrhea.  Genitourinary: Positive for vaginal discharge and pelvic pain. Negative for dysuria, vaginal bleeding and vaginal pain.  Musculoskeletal: Positive for back pain.   Other systems negative  Physical Exam  Patient Vitals for the past 24 hrs:  BP Temp Temp src Pulse Resp  11/16/15 0742 131/73 mmHg 98.8 F (37.1 C) Oral 70 22  11/16/15 0637 134/83 mmHg 98 F (36.7 C) Oral - 20   Constitutional: Well-developed, well-nourished female in no acute distress, but very uncomfortable with pain.  Cardiovascular: normal rate and rhythm Respiratory: normal effort, clear to auscultation bilaterally GI: Abd soft, non-tender, gravid appropriate for gestational age.   No rebound or guarding. MS: Extremities nontender, no edema, normal ROM Neurologic: Alert and oriented  x 4.  GU: Neg CVAT.  PELVIC EXAM: Dilation: 3 Effacement (%): 60 Station: -3 Exam by:: Morrison Old RN  FHT:  Baseline 140 , moderate variability, accelerations present, no decelerations Contractions: q 2-4 mins Irregular     Labs: Results for orders placed or performed during the hospital encounter of 11/16/15 (from the past 24 hour(s))  CBC     Status: Abnormal   Collection Time: 11/16/15  6:40 AM  Result Value Ref Range   WBC 15.1 (H) 4.0 - 10.5 K/uL   RBC 4.03 3.87 - 5.11 MIL/uL   Hemoglobin 10.3 (L) 12.0 - 15.0 g/dL   HCT 44.0 (L) 10.2 - 72.5 %   MCV 78.7 78.0 - 100.0 fL   MCH 25.6  (L) 26.0 - 34.0 pg   MCHC 32.5 30.0 - 36.0 g/dL   RDW 36.6 44.0 - 34.7 %   Platelets 374 150 - 400 K/uL  Differential     Status: Abnormal   Collection Time: 11/16/15  6:40 AM  Result Value Ref Range   Neutrophils Relative % 83 %   Neutro Abs 12.5 (H) 1.7 - 7.7 K/uL   Lymphocytes Relative 12 %   Lymphs Abs 1.9 0.7 - 4.0 K/uL   Monocytes Relative 4 %   Monocytes Absolute 0.7 0.1 - 1.0 K/uL   Eosinophils Relative 1 %   Eosinophils Absolute 0.1 0.0 - 0.7 K/uL   Basophils Relative 0 %   Basophils Absolute 0.0 0.0 - 0.1 K/uL  Type and screen [facility]     Status: None   Collection Time: 11/16/15  6:40 AM  Result Value Ref Range   ABO/RH(D) A POS    Antibody Screen NEG    Sample Expiration 11/19/2015    --/--/A POS (05/03 4259)  Imaging:  No results found.  MAU Course/MDM: I have ordered labs and reviewed results.  NST reviewed  Fentanyl given for pain. I added Vistaril to help calm the patient. Recheck of cervix showed no change. Second recheck showed still no change.  Awaiting delivery team to determine disposition  Assessment: 1. No prenatal care in current pregnancy   2.    Contractions, prodromal 3.    History or polysubstance abuse, reportedly on Subutex treatment now  Plan: Plan per delivery team.    Medication List    ASK your doctor about these medications        buprenorphine 2 MG Subl SL tablet  Commonly known as:  SUBUTEX  Place 8 mg under the tongue 2 (two) times daily.     calcium carbonate 500 MG chewable tablet  Commonly known as:  TUMS - dosed in mg elemental calcium  Chew 1 tablet by mouth daily.         Wynelle Bourgeois CNM, MSN Certified Nurse-Midwife 11/16/2015 10:47 AM

## 2015-11-17 ENCOUNTER — Encounter (HOSPITAL_COMMUNITY): Payer: Self-pay | Admitting: *Deleted

## 2015-11-17 ENCOUNTER — Inpatient Hospital Stay (HOSPITAL_COMMUNITY)
Admission: AD | Admit: 2015-11-17 | Discharge: 2015-11-19 | DRG: 775 | Disposition: A | Discharge status: Home / Self Care | Payer: Medicaid Other | Source: Ambulatory Visit | Attending: Obstetrics & Gynecology | Admitting: Obstetrics & Gynecology

## 2015-11-17 DIAGNOSIS — N390 Urinary tract infection, site not specified: Secondary | ICD-10-CM | POA: Diagnosis present

## 2015-11-17 DIAGNOSIS — F1120 Opioid dependence, uncomplicated: Secondary | ICD-10-CM | POA: Diagnosis present

## 2015-11-17 DIAGNOSIS — Z3A37 37 weeks gestation of pregnancy: Secondary | ICD-10-CM

## 2015-11-17 DIAGNOSIS — Z23 Encounter for immunization: Secondary | ICD-10-CM

## 2015-11-17 DIAGNOSIS — O99324 Drug use complicating childbirth: Principal | ICD-10-CM | POA: Diagnosis present

## 2015-11-17 DIAGNOSIS — F17210 Nicotine dependence, cigarettes, uncomplicated: Secondary | ICD-10-CM | POA: Diagnosis present

## 2015-11-17 DIAGNOSIS — O99334 Smoking (tobacco) complicating childbirth: Secondary | ICD-10-CM | POA: Diagnosis present

## 2015-11-17 LAB — RUBELLA SCREEN: Rubella: >33 index (ref >0.99)

## 2015-11-17 MED ORDER — BENZOCAINE-MENTHOL 20-0.5 % EX AERO
1.0000 "application " | INHALATION_SPRAY | CUTANEOUS | Status: DC | PRN
  Administered 2015-11-17: 1 via TOPICAL
  Filled 2015-11-17: qty 56

## 2015-11-17 MED ORDER — DIPHENHYDRAMINE HCL 25 MG PO CAPS: 25.0000 mg | ORAL_CAPSULE | Freq: Four times a day (QID) | ORAL | Status: DC | PRN

## 2015-11-17 MED ORDER — ONDANSETRON HCL 4 MG/2ML IJ SOLN: 4.0000 mg | INTRAMUSCULAR | Status: DC | PRN

## 2015-11-17 MED ORDER — BUPRENORPHINE HCL 8 MG SL SUBL
8.0000 mg | SUBLINGUAL_TABLET | Freq: Two times a day (BID) | SUBLINGUAL | Status: DC
  Administered 2015-11-17 – 2015-11-19 (×5): 8 mg via SUBLINGUAL
  Filled 2015-11-17 (×5): qty 1

## 2015-11-17 MED ORDER — COCONUT OIL OIL: 1.0000 "application " | TOPICAL_OIL | Status: DC | PRN

## 2015-11-17 MED ORDER — ACETAMINOPHEN 325 MG PO TABS: 650.0000 mg | ORAL_TABLET | ORAL | Status: DC | PRN

## 2015-11-17 MED ORDER — ONDANSETRON HCL 4 MG PO TABS: 4.0000 mg | ORAL_TABLET | ORAL | Status: DC | PRN

## 2015-11-17 MED ORDER — PRENATAL MULTIVITAMIN CH
1.0000 | ORAL_TABLET | Freq: Every day | ORAL | Status: DC
  Administered 2015-11-17 – 2015-11-19 (×3): 1 via ORAL
  Filled 2015-11-17 (×3): qty 1

## 2015-11-17 MED ORDER — TETANUS-DIPHTH-ACELL PERTUSSIS 5-2.5-18.5 LF-MCG/0.5 IM SUSP: 0.5000 mL | Freq: Once | INTRAMUSCULAR | Status: DC

## 2015-11-17 MED ORDER — SIMETHICONE 80 MG PO CHEW: 80.0000 mg | CHEWABLE_TABLET | ORAL | Status: DC | PRN

## 2015-11-17 MED ORDER — IBUPROFEN 600 MG PO TABS
600.0000 mg | ORAL_TABLET | Freq: Four times a day (QID) | ORAL | Status: DC
  Administered 2015-11-17 – 2015-11-19 (×8): 600 mg via ORAL
  Filled 2015-11-17 (×8): qty 1

## 2015-11-17 MED ORDER — SENNOSIDES-DOCUSATE SODIUM 8.6-50 MG PO TABS
2.0000 | ORAL_TABLET | ORAL | Status: DC
  Administered 2015-11-17 – 2015-11-19 (×2): 2 via ORAL
  Filled 2015-11-17 (×2): qty 2

## 2015-11-17 MED ORDER — KETOROLAC TROMETHAMINE 60 MG/2ML IM SOLN
60.0000 mg | Freq: Once | INTRAMUSCULAR | Status: AC
  Administered 2015-11-17: 60 mg via INTRAMUSCULAR
  Filled 2015-11-17: qty 2

## 2015-11-17 MED ORDER — OXYTOCIN 10 UNIT/ML IJ SOLN
INTRAMUSCULAR | Status: AC
  Administered 2015-11-17: 10 [IU]
  Filled 2015-11-17: qty 1

## 2015-11-17 NOTE — H&P (Signed)
Joy GoltzVictoria A Herrera is a 30 y.o. female who presented to MAU crowning. History  Ms. Joy Herrera is a 30yo G4 now P2112 who presented to the MAU this morning crowning with birth on arrival.  She had not had any PNC and had been seen here for the first time on 5/3 when she presented with contractions.  She reportedly had an appointment that afternoon with the NICU.  An US during 5/3 gave an estimated EDD of 12/08/15.  She was discharged home from MAU with plans to have her follow-up in clinic.  She states that her contractions continued an intensified at home, so she returned to the MAU the morning of 5/4 after her water ruptured at home.  Her pregnancy is complicated by opiate dependence.  She is currently on buprenoprhine 8mg  BID, which is confirmed in the [facility] with last Rx 4/28 by Margaret-Mary Bowen in [place].  A UDS collected in the MAU on 5/3 was positive for opiates only.  OB History    Gravida Para Term Preterm AB TAB SAB Ectopic Multiple Living   4 3 2 1 1  1   0 2     Past Medical History  Diagnosis Date  . Asthma   . Anxiety    She denies history of surgeries. Family History: family history is not on file. Social History:  reports that she has been smoking Cigarettes.  She has a 2.5 pack-year smoking history. She does not have any smokeless tobacco history on file. She reports that she drinks alcohol. She reports that she uses illicit drugs (Marijuana).   Prenatal Transfer Tool  Maternal Diabetes: no testing Genetic Screening: no testing Maternal Ultrasounds/Referrals: US on 5/3 unremarkable, though obviously limited by advanced GA Fetal Ultrasounds or other Referrals:  As above Maternal Substance Abuse:  Yes:  Type: Other: opiate dependence on buprenorphine Significant Maternal Medications:  As above Significant Maternal Lab Results:  GBS unknown Other Comments:  Other routine prenatal labs collected in MAU 5/3 unremarkable  ROS No fevers/chills No runny nose/sore throat No  chest pain/SOB No nausea/vomiting/diarrhea No dysuria/hematuria No rash Otherwise per HPI   Blood pressure 112/72, pulse 87, temperature 98.7 F (37.1 C), temperature source Axillary, resp. rate 16, unknown if currently breastfeeding. Exam Physical Exam  Gen: alert, in NAD HEENT: NCAT, normal conjunctivae, moist oral mucosa Chest: normal WOB, lungs CTAB CV: normal rate and regular rhythm, normal S1 and S2, no m/r/g Abd: soft Ext: trace pedal edema bilaterally Skin: no rashes noted Psych: cooperative, appropriate affect  Prenatal labs: ABO, Rh: --/--/A POS, A POS (05/03 0640) Antibody: NEG (05/03 0640) Rubella: >33.00 (05/03 0640) RPR: Non Reactive (05/03 0640)  HBsAg: Negative (05/03 0640)  HIV: Non Reactive (05/03 0640)  GBS: unknown  Assessment/Plan: 16XW 30 G4P1111 at 37+0 presented to MAU with birth on arrival.  -- Admit to Mother Baby unit with routine postpartum care. -- Continue home buprenoprhine dose (confirmed in [facility]). -- Patient declines nicotine replacement patch.  Encouraged cessation; she is contemplative about this. -- Girl/Breast/Nexplanon  Caesar ChestnutKelly E Scherry Laverne 11/17/2015, 9:30 PM

## 2015-11-17 NOTE — Lactation Note (Signed)
This note was copied from a baby's chart. Lactation Consultation Note  Patient Name: Joy Herrera Today's Date: 11/17/2015 Reason for consult: Initial assessment Baby at 8 hr of life and mom reports bf is going well. She stated she wants to offer breast and formula. She denies breast or nipple pain. Stated her milk "came in" an hr after birth with her son. She refused to put the warming hat on baby. She stated "it makes her sweet". Discussed baby behavior, feeding frequency, supplementing, baby belly size, voids, wt loss, breast changes, and nipple care. She stated she can manually express, has seen colostrum bilaterally, and has a spoon in the room. Given lactation handouts. Aware of OP services and support group.    Maternal Data Has patient been taught Hand Expression?: Yes Does the patient have breastfeeding experience prior to this delivery?: Yes  Feeding Feeding Type: Breast Fed Length of feed: 0 min  LATCH Score/Interventions Latch: Repeated attempts needed to sustain latch, nipple held in mouth throughout feeding, stimulation needed to elicit sucking reflex. Intervention(s): Adjust position;Assist with latch;Breast massage;Breast compression  Audible Swallowing: None Intervention(s): Skin to skin  Type of Nipple: Everted at rest and after stimulation  Comfort (Breast/Nipple): Soft / non-tender     Hold (Positioning): Assistance needed to correctly position infant at breast and maintain latch. Intervention(s): Breastfeeding basics reviewed;Support Pillows;Position options;Skin to skin  LATCH Score: 6  Lactation Tools Discussed/Used WIC Program: No   Consult Status Consult Status: Follow-up Date: 11/18/15 Follow-up type: In-patient    Joy Herrera 11/17/2015, 5:37 PM

## 2015-11-18 ENCOUNTER — Encounter (HOSPITAL_COMMUNITY): Payer: Self-pay | Admitting: *Deleted

## 2015-11-18 LAB — CBC
HEMATOCRIT: 30.8 % — AB (ref 36.0–46.0)
Hemoglobin: 9.7 g/dL — ABNORMAL LOW (ref 12.0–15.0)
MCH: 24.9 pg — ABNORMAL LOW (ref 26.0–34.0)
MCHC: 31.5 g/dL (ref 30.0–36.0)
MCV: 79.2 fL (ref 78.0–100.0)
PLATELETS: 378 10*3/uL (ref 150–400)
RBC: 3.89 MIL/uL (ref 3.87–5.11)
RDW: 15 % (ref 11.5–15.5)
WBC: 15 10*3/uL — AB (ref 4.0–10.5)

## 2015-11-18 NOTE — Progress Notes (Signed)
Post Partum Day 1 Subjective: no complaints, up ad lib, voiding and tolerating PO, small lochia, plans to breastfeed, [place]  Objective: Blood pressure 115/60, pulse 66, temperature 98.2 F (36.8 C), temperature source Oral, resp. rate 16, unknown if currently breastfeeding.  Physical Exam:  General: alert, cooperative and no distress Lochia:normal flow Chest: CTAB Heart: RRR no m/r/g Abdomen: +BS, soft, nontender,  Uterine Fundus: firm DVT Evaluation: No evidence of DVT seen on physical exam. Extremities: trace edema   Recent Labs  11/16/15 0640 11/18/15 0530  HGB 10.3* 9.7*  HCT 31.7* 30.8*    Assessment/Plan: Plan for DC tomorrow.  Probably room-in d/t subutex.  Social work and lactation consult.    LOS: 1 day   CRESENZO-DISHMAN,Belita Warsame 11/18/2015, 6:47 AM

## 2015-11-18 NOTE — Progress Notes (Signed)
UR chart review completed.  

## 2015-11-18 NOTE — Clinical Social Work Maternal (Signed)
CLINICAL SOCIAL WORK MATERNAL/CHILD NOTE  Patient Details  Name: Joy Herrera MRN: [id number] Date of Birth: 04/28/1986  Date:  11/18/2015  Clinical Social Worker Initiating Note:  Daishaun Ayre MSW, LCSW Date/ Time Initiated:  11/18/15/1100     Child's Name:  [name]   Legal Guardian:  Zanylah Eskridge and Jack Brown  Need for Interpreter:  None   Date of Referral:  11/17/15     Reason for Referral:  No Prenatal Care ,  Subutex use for history of opiate addiction  Referral Source:  Central Nursery   Address:  [address] [place], [place] [address]  Phone number:  [phone]   Household Members:  Self   Natural Supports (not living in the home):  Spouse/significant other, Immediate Family, Extended Family   Professional Supports: [facility]-- Subutex maintenance   Employment: Unemployed   Type of Work:   N/A  Education:    N/A  Financial Resources:  Self-Pay    Other Resources:      Cultural/Religious Considerations Which May Impact Care:  None reported  Strengths:  Ability to meet basic needs , Home prepared for child , Pediatrician chosen    Risk Factors/Current Problems:   1. Substance Use-- MOB presents with a history of oxycodone abuse. MOB reported daily oxycodone abuse, until 2 weeks ago she lost  2., DHHS Involvement    Cognitive State:  Able to Concentrate , Alert , Goal Oriented , Linear Thinking    Mood/Affect:  Calm , Comfortable    CSW Assessment:  CSW received request for consult due to MOB presenting with no prenatal care during this pregnancy and current participation in a Subutex program.   MOB presented in a pleasant and displayed a full range in affect. MOB was noted to be breastfeeding and caring for the infant during the assessment.   MOB was vague and a limited historian as evidence by often reporting that she could not remember or could not recall when asked questions about her pregnancy and prior history.   MOB  stated that she could not remember exact date which she realized she was pregnant, but stated that it was likely 2-3 months ago. She shared that it was a "surprise" since she did not think she could get pregnant. MOB reported that she became happy and excited upon the news of the pregnancy, and shared that the FOB has also been happy, excited, involved, and supportive.  MOB confirmed that the home is prepared for the infant, and that all basic needs are met. She stated that she is unemployed, but stated that the FOB is employed.  MOB reported strong family support.  Per MOB, her 10 year old son lives with his father, and stated that he has not lived with her for 2-3 years. MOB was a limited historian regarding change in custody, and was unable to clarify events that led to loss of custody.  MOB stated that she still maintains contact with him, and stated that her son came to visit on 5/4 and is excited to be a big brother.   MOB confirmed history of no prenatal care.  MOB stated that she was "denied" Medicaid and could not afford prenatal appointments.  It remains unclear if this is accurate since she is currently unemployed and lives by herself.   MOB confirmed history of anxiety, but denied belief that anxiety has been a presenting problem for more 3 years. MOB did not express any interest or desire to further discuss history   of anxiety.  MOB reported history of medication management, and denied prior history of therapy.   MOB confirmed current participation in a Subutex program through [facility].  MOB stated that she started the program approximately 2 weeks ago. She stated that she does not believe that she needs to be in the program; however, reported that she started the program since she was abusing "pain pills". MOB reported history of abusing oxycodone. She stated that she cannot remember why she was initially prescribed medication, when, or by whom, but stated that it slowly developed to a physical  addiction.  MOB stated that she does not have a current prescription, and started that would take 1-2 pills per day.   MOB denied any unprescribed use once she started the Subutex program.  MOB providing conflicting information in regards to her current motivation and intention to continue the program as she previously reported not understanding the need for program, but then later reported that she felt that it was "better" for herself and the infant. MOB was unable to identify exact concrete evidence that demonstrates why she believes Subutex is better or the benefits that she has received as a result in the participation in the program.  MOB was able to acknowledge the potential negative impacts if she were to re-start use, but potential impacts were largely related to running out of money.   CSW provided education on infant's risk for NAS, common symptoms, and highlighted comfort measures to support the infant.  MOB stated that she "researched" what to anticipate and expect.   CSW reviewed and recognized MOB's ability to identify her prior history as a problem and her ability to enroll in a program.  CSW discussed need to refer to CPS due to recent unprescribed use.  CSW attempted to frame the referral as an effort to assist her to continue in recovery.  MOB remained calm, and asked what to anticipate and expect.  CSW provided education.  MOB confirmed history of CPS involvement, but again did not further clarify or discuss prior outcomes of CPS involvement.   CSW made CPS report in [place], and CPS expressed prior history of CPS involvement. CPS unable to discuss details with CSW, but expressed intention to immediately follow up with the referral.  CSW to remain in contact with CPS.   CSW Plan/Description:   1. Patient/Family Education -- hospital drug screen policy 2. Child Protective Service Report -- [place] CPS report  3. CSW consulted with infant's PA in recommended minimum  5 day stay which is congruent with NAS protocol.  CSW also discussed concerns about recent opiate abuse, history of CPS involvement, and need for CPS investigation.  4 Psychosocial Support and Ongoing Assessment of Needs -- CSW will follow up with CPS in order to receive update on the status of their investigation and to receive their recommendations for infant's discharge.   Malakai Schoenherr N, LCSW 11/18/2015, 3:28 PM  

## 2015-11-18 NOTE — Progress Notes (Addendum)
CSW informed that [place] CPS worker, Amber Garrett initiated CPS investigation this afternoon.  CSW spoke with CPS intake worker who stated that another CPS worker visited MOB's home, and MOB has minimal baby supplies in the home.  CPS also informed CSW that the MOB has lost custody of her 31 year old son.   CPS will need to staff case with supervisor, but discussed awareness of acute safety concerns.   Discharge recommendations from CPS not yet known. CSW to remain involved. 

## 2015-11-18 NOTE — Lactation Note (Signed)
This note was copied from a baby's chart. Lactation Consultation Note  Patient Name: Joy Herrera Today's Date: 11/18/2015 Reason for consult: Follow-up assessment;Other (Comment) (early term baby at 2437.0) Assisted Mom with latching baby to obtain more depth. Mom using cradle hold and baby just latching to base of nipple. Mom has very large nipples, LC demonstrated using breast compression to help baby obtain more depth, changed Mom to cross cradle. Once baby latched deeply, she demonstrated some good suckling with few swallows noted. Mom has lots of colostrum with hand expression. Due to baby at 37.0 gestation, discussed post pumping for Mom to maximize milk production and to have EBM if needed to supplement. Mom agreed, set up Symphony DEBP, encouraged to post pump every 3 hours for 15 minutes except at night. Call for assist if she receives any EBM for RN or LC to demonstrate spoon or finger feeding back to baby. Mom history of Subutex, opiate use. Stressed importance of baby having Mom's EBM to minimize withdrawal symptoms. Advised baby should be at breast 8-12 times in 24 hours and with feeding ques, nursing for 15-20 minutes both breasts some feedings.  Encouraged Mom to call for assist as needed.   Maternal Data    Feeding Feeding Type: Breast Fed Length of feed: 15 min  LATCH Score/Interventions Latch: Repeated attempts needed to sustain latch, nipple held in mouth throughout feeding, stimulation needed to elicit sucking reflex. Intervention(s): Adjust position;Assist with latch;Breast massage;Breast compression  Audible Swallowing: A few with stimulation  Type of Nipple: Everted at rest and after stimulation  Comfort (Breast/Nipple): Soft / non-tender     Hold (Positioning): Assistance needed to correctly position infant at breast and maintain latch. Intervention(s): Breastfeeding basics reviewed;Support Pillows;Position options;Skin to skin  LATCH Score: 7  Lactation  Tools Discussed/Used Tools: Pump Breast pump type: Double-Electric Breast Pump   Consult Status Consult Status: Follow-up Date: 11/19/15 Follow-up type: In-patient    Alfred LevinsGranger, Shylee Durrett Ann 11/18/2015, 5:06 PM

## 2015-11-19 DIAGNOSIS — N390 Urinary tract infection, site not specified: Secondary | ICD-10-CM | POA: Diagnosis present

## 2015-11-19 LAB — URINALYSIS, ROUTINE W REFLEX MICROSCOPIC
BILIRUBIN URINE: NEGATIVE
Glucose, UA: NEGATIVE mg/dL
Ketones, ur: NEGATIVE mg/dL
NITRITE: POSITIVE — AB
PROTEIN: NEGATIVE mg/dL
SPECIFIC GRAVITY, URINE: 1.01 (ref 1.005–1.030)
pH: 6 (ref 5.0–8.0)

## 2015-11-19 LAB — URINE MICROSCOPIC-ADD ON

## 2015-11-19 MED ORDER — CEPHALEXIN 500 MG PO CAPS: 500.0000 mg | ORAL_CAPSULE | Freq: Four times a day (QID) | ORAL | Status: DC

## 2015-11-19 MED ORDER — ACETAMINOPHEN 325 MG PO TABS
650.0000 mg | ORAL_TABLET | ORAL | Status: DC | PRN
Start: 2015-11-19 — End: 2016-03-26

## 2015-11-19 MED ORDER — IBUPROFEN 600 MG PO TABS: 600.0000 mg | ORAL_TABLET | Freq: Four times a day (QID) | ORAL | Status: AC

## 2015-11-19 MED ORDER — PRENATAL MULTIVITAMIN CH: 1.0000 | ORAL_TABLET | Freq: Every day | ORAL | Status: AC

## 2015-11-19 NOTE — Discharge Instructions (Signed)

## 2015-11-19 NOTE — Discharge Summary (Signed)
OB Discharge Summary     Patient Name: Joy Herrera DOB: 12/25/1985 MRN: [id number]  Date of admission: 11/17/2015 Delivering MD: Thressa ShellerHOGAN, HEATHER D   Date of discharge: 11/19/2015  Admitting diagnosis: LABOR Intrauterine pregnancy: 2622w0d     Secondary diagnosis:  Active Problems:   Spontaneous vaginal delivery  Additional problems: opiate dependence No prenatal care     Discharge diagnosis: Term Pregnancy Delivered                                                                                                Post partum procedures:none  Augmentation: none  Complications: None  Hospital course:  Onset of Labor With Vaginal Delivery     30 y.o. yo [id number] at 5222w0d was admitted in Active Labor on 11/17/2015. Patient had an uncomplicated labor course as follows: She presented to the [facility] crowning and quickly delivered a viable infant without complication. Membrane Rupture Time/Date: 7:30 AM ,11/17/2015   Intrapartum Procedures: Episiotomy:                                           Lacerations:     Patient had a delivery of a Viable infant. 11/17/2015  Information for the patient's newborn:  Micheline RoughMeranda, Girl TurkeyVictoria [[id number]][[id number]]  Delivery Method:  Ceasar Mons(Filed from Delivery Summary)    Pateint had an uncomplicated postpartum course.  She is ambulating, tolerating a regular diet, passing flatus, and urinating well. Patient is discharged home in stable condition on 11/19/2015.    Physical exam  Filed Vitals:   11/17/15 1532 11/18/15 0545 11/18/15 1859 11/19/15 0500  BP: 112/72 115/60 121/68 93/60  Pulse: 87 66 66 55  Temp: 98.7 F (37.1 C) 98.2 F (36.8 C) 98.2 F (36.8 C) 97.6 F (36.4 C)  TempSrc: Axillary Oral Oral Oral  Resp: 16  18 16    General: alert, cooperative and no distress Lochia: appropriate Uterine Fundus: firm Incision: N/A DVT Evaluation: No evidence of DVT seen on physical exam. Labs: Lab Results  Component Value Date   WBC 15.0* 11/18/2015   HGB 9.7*  11/18/2015   HCT 30.8* 11/18/2015   MCV 79.2 11/18/2015   PLT 378 11/18/2015   CMP Latest Ref Rng 03/12/2012  Glucose 70 - 99 mg/dL 034(V113(H)  BUN 6 - 23 mg/dL 7  Creatinine 4.250.50 - 9.561.10 mg/dL 3.870.71  Sodium 564135 - 332145 mEq/L 136  Potassium 3.5 - 5.1 mEq/L 3.6  Chloride 96 - 112 mEq/L 103  CO2 19 - 32 mEq/L 23  Calcium 8.4 - 10.5 mg/dL 9.0  Total Protein 6.0 - 8.3 g/dL -  Total Bilirubin 0.3 - 1.2 mg/dL -  Alkaline Phos 39 - 951117 U/L -  AST 0 - 37 U/L -  ALT 0 - 35 U/L -    Discharge instruction: per After Visit Summary and "Baby and Me Booklet".  After visit meds:    Medication List    STOP taking these medications        calcium carbonate  500 MG chewable tablet  Commonly known as:  TUMS - dosed in mg elemental calcium     zolpidem 10 MG tablet  Commonly known as:  AMBIEN      TAKE these medications        acetaminophen 325 MG tablet  Commonly known as:  TYLENOL  Take 2 tablets (650 mg total) by mouth every 4 (four) hours as needed (for pain scale < 4).     buprenorphine 8 MG Subl SL tablet  Commonly known as:  SUBUTEX  Place 8 mg under the tongue 2 (two) times daily.     ibuprofen 600 MG tablet  Commonly known as:  ADVIL,MOTRIN  Take 1 tablet (600 mg total) by mouth every 6 (six) hours.     prenatal multivitamin Tabs tablet  Take 1 tablet by mouth daily at 12 noon.        Diet: routine diet  Activity: Advance as tolerated. Pelvic rest for 6 weeks.   Outpatient follow up:6 weeks Follow up Appt:No future appointments. Follow up Visit:No Follow-up on file.  Postpartum contraception: Nexplanon  Newborn Data: Live born female  Birth Weight: 6 lb 8.2 oz (2955 g) APGAR: 9, 9  Baby Feeding: Breast Disposition:SW/CPS actively working on dispo plan for infant   11/19/2015 Caesar Chestnut, MD   CNM attestation I have seen and examined this patient and agree with above documentation in the resident's note.   DEZIRAE SERVICE is a 30 y.o. [id number] s/p SVD.    Pain is well controlled.  Plan for birth control is Nexplanon.  Method of Feeding: breast  PE:  BP 93/60 mmHg  Pulse 55  Temp(Src) 97.6 F (36.4 C) (Oral)  Resp 16  Breastfeeding? Unknown Fundus firm  No results for input(s): HGB, HCT in the last 72 hours.   Plan: discharge today - postpartum care discussed - f/u clinic in 6 weeks for postpartum visit   SHAW, KIMBERLY, CNM 10:17 PM

## 2015-11-21 ENCOUNTER — Ambulatory Visit: Payer: Self-pay

## 2015-11-21 LAB — URINE CULTURE
Culture: >=100000 — AB
Special Requests: NORMAL

## 2015-11-21 LAB — GC/CHLAMYDIA PROBE AMP (~~LOC~~) NOT AT ARMC
Chlamydia: NEGATIVE
NEISSERIA GONORRHEA: NEGATIVE

## 2015-11-21 NOTE — Lactation Note (Signed)
This note was copied from a baby's chart. Lactation Consultation Note  Met with mom in the NICU.  Observed baby latched well and nursing actively.  Mom is using a hand pump at home and obtaining 30-60 mls.  She forgot some of her pieces for symphony pump when she left hospital so supplied with a new kit.  Comfort gels given for nipple soreness.  Instructed to pump every 3 hours until milk is just dripping.  Patient Name: Joy Herrera Today's Date: 11/21/2015     Maternal Data    Feeding Feeding Type: Breast Milk Nipple Type: Slow - flow Length of feed: 15 min  LATCH Score/Interventions Latch: Grasps breast easily, tongue down, lips flanged, rhythmical sucking.  Audible Swallowing: Spontaneous and intermittent  Type of Nipple: Everted at rest and after stimulation  Comfort (Breast/Nipple): Soft / non-tender     Hold (Positioning): Assistance needed to correctly position infant at breast and maintain latch.  LATCH Score: 9  Lactation Tools Discussed/Used     Consult Status      Ave Filter 11/21/2015, 1:43 PM

## 2015-11-28 ENCOUNTER — Encounter (HOSPITAL_COMMUNITY): Payer: Self-pay | Admitting: *Deleted

## 2015-12-27 ENCOUNTER — Ambulatory Visit: Payer: Medicaid Other | Admitting: Medical

## 2015-12-30 ENCOUNTER — Encounter (HOSPITAL_COMMUNITY): Payer: Self-pay | Admitting: *Deleted

## 2016-01-13 ENCOUNTER — Encounter (HOSPITAL_COMMUNITY): Payer: Self-pay

## 2016-01-13 ENCOUNTER — Ambulatory Visit (HOSPITAL_COMMUNITY)
Admission: RE | Admit: 2016-01-13 | Discharge: 2016-01-13 | Disposition: A | Discharge status: Home / Self Care | Payer: Medicaid Other | Source: Ambulatory Visit | Attending: Obstetrics and Gynecology | Admitting: Obstetrics and Gynecology

## 2016-01-13 VITALS — BP 102/68 | Temp 97.9°F | Ht 64.0 in | Wt 152.0 lb

## 2016-01-13 DIAGNOSIS — Z1239 Encounter for other screening for malignant neoplasm of breast: Secondary | ICD-10-CM

## 2016-01-13 DIAGNOSIS — R87810 Cervical high risk human papillomavirus (HPV) DNA test positive: Secondary | ICD-10-CM

## 2016-01-13 DIAGNOSIS — R87610 Atypical squamous cells of undetermined significance on cytologic smear of cervix (ASC-US): Secondary | ICD-10-CM

## 2016-01-13 NOTE — Progress Notes (Signed)
[facility] due to having an abnormal Pap smear 12/20/2015 that a colposcopy is recommended for follow up.  Pap Smear:  Pap smear not completed today. [facility] with positive HPV. [facility] for a colposocopy to follow up for abnormal Pap smear. Appointment scheduled for Wednesday, February 15, 2016 at 1320. Per patient her most recent Pap smear is the only abnormal Pap smear she has had. Last Pap smear result is in EPIC.  Physical exam: Breasts Breasts symmetrical. No skin abnormalities bilateral breasts. No nipple retraction bilateral breasts. Patient is currently breastfeeding her 72 month old. No lymphadenopathy. No lumps palpated bilateral breasts. No complaints of pain or tenderness on exam. Screening mammogram recommended at age 30 unless clinically indicated prior.  Pelvic/Bimanual No Pap smear completed today since last Pap smear was 12/20/2015. Pap smear not indicated per BCCCP guidelines.   Smoking History: Patient is a current smoker. Discussed smoking cessation with patient. Referred to the [facility] and gave resources to the free classes offered at the [facility].   Patient Navigation: Patient education provided. Access to services provided for [facility] through [facility].

## 2016-01-13 NOTE — Patient Instructions (Addendum)
Educational materials on self breast awareness given. Explained to Joy Herrera the colposcopy the needed follow up for her abnormal Pap smear on 12/20/2015. [facility] for a colposocopy to follow up for abnormal Pap smear. Appointment scheduled for Wednesday, February 15, 2016 at 1320. Patient aware of appointment and will be there. Screening mammogram recommended at age 30 unless clinically indicated prior. Discussed smoking cessation with patient. Referred [facility] the [facility] and gave resources to the free classes offered at the [facility]. TurkeyVictoria A Tamburro verbalized understanding.  [place], Kathaleen Maserhristine Poll, RN 1:32 PM

## 2016-01-16 ENCOUNTER — Encounter (HOSPITAL_COMMUNITY): Payer: Self-pay | Admitting: *Deleted

## 2016-02-15 ENCOUNTER — Encounter: Payer: Self-pay | Admitting: Obstetrics & Gynecology

## 2016-03-02 ENCOUNTER — Encounter: Payer: Self-pay | Admitting: *Deleted

## 2016-03-06 ENCOUNTER — Telehealth (HOSPITAL_COMMUNITY): Payer: Self-pay | Admitting: *Deleted

## 2016-03-06 NOTE — Telephone Encounter (Signed)
Telephoned patient at home number and left message to return call to BCCCP 

## 2016-03-26 ENCOUNTER — Ambulatory Visit (INDEPENDENT_AMBULATORY_CARE_PROVIDER_SITE_OTHER): Payer: Self-pay | Admitting: Obstetrics and Gynecology

## 2016-03-26 ENCOUNTER — Other Ambulatory Visit (HOSPITAL_COMMUNITY)
Admission: RE | Admit: 2016-03-26 | Discharge: 2016-03-26 | Disposition: A | Discharge status: Home / Self Care | Payer: Self-pay | Source: Ambulatory Visit | Attending: Obstetrics and Gynecology | Admitting: Obstetrics and Gynecology

## 2016-03-26 ENCOUNTER — Encounter: Payer: Self-pay | Admitting: Obstetrics and Gynecology

## 2016-03-26 ENCOUNTER — Other Ambulatory Visit: Payer: Self-pay

## 2016-03-26 VITALS — BP 140/84 | HR 72 | Ht 64.0 in | Wt 159.0 lb

## 2016-03-26 DIAGNOSIS — N870 Mild cervical dysplasia: Secondary | ICD-10-CM | POA: Insufficient documentation

## 2016-03-26 DIAGNOSIS — R896 Abnormal cytological findings in specimens from other organs, systems and tissues: Secondary | ICD-10-CM

## 2016-03-26 DIAGNOSIS — IMO0002 Reserved for concepts with insufficient information to code with codable children: Secondary | ICD-10-CM

## 2016-03-26 DIAGNOSIS — Z3202 Encounter for pregnancy test, result negative: Secondary | ICD-10-CM

## 2016-03-26 LAB — POCT URINALYSIS DIP (DEVICE)
BILIRUBIN URINE: NEGATIVE
Glucose, UA: NEGATIVE mg/dL
HGB URINE DIPSTICK: NEGATIVE
Ketones, ur: NEGATIVE mg/dL
Nitrite: POSITIVE — AB
PH: 5.5 (ref 5.0–8.0)
Protein, ur: NEGATIVE mg/dL
Specific Gravity, Urine: 1.025 (ref 1.005–1.030)
Urobilinogen, UA: 0.2 mg/dL (ref 0.0–1.0)

## 2016-03-26 LAB — POCT PREGNANCY, URINE: Preg Test, Ur: NEGATIVE

## 2016-03-26 MED ORDER — METOCLOPRAMIDE HCL 5 MG PO TABS: 5.0000 mg | ORAL_TABLET | Freq: Three times a day (TID) | ORAL | 0 refills | Status: AC | PRN

## 2016-03-26 NOTE — Telephone Encounter (Signed)
Patient is requesting a rx for reglan. Per Provider ok to call in a limited supply of reglan for patient.

## 2016-03-26 NOTE — Patient Instructions (Signed)
Colposcopy  Colposcopy is a procedure to examine your cervix and vagina, or the area around the outside of your vagina, for abnormalities or signs of disease. The procedure is done using a lighted microscope called a colposcope. Tissue samples may be collected during the colposcopy if your health care provider finds any unusual cells. A colposcopy may be done if a woman has:  · An abnormal Pap test. A Pap test is a medical test done to evaluate cells that are on the surface of the cervix.  · A Pap test result that is suggestive of human papillomavirus (HPV). This virus can cause genital warts and is linked to the development of cervical cancer.  · A sore on her cervix and the results of a Pap test were normal.  · Genital warts on the cervix or in or around the outside of the vagina.  · A mother who took the drug diethylstilbestrol (DES) while pregnant.  · Painful intercourse.  · Vaginal bleeding, especially after sexual intercourse.  LET YOUR HEALTH CARE PROVIDER KNOW ABOUT:  · Any allergies you have.  · All medicines you are taking, including vitamins, herbs, eye drops, creams, and over-the-counter medicines.  · Previous problems you or members of your family have had with the use of anesthetics.  · Any blood disorders you have.  · Previous surgeries you have had.  · Medical conditions you have.  RISKS AND COMPLICATIONS  Generally, a colposcopy is a safe procedure. However, as with any procedure, complications can occur. Possible complications include:  · Bleeding.  · Infection.  · Missed lesions.  BEFORE THE PROCEDURE   · Tell your health care provider if you have your menstrual period. A colposcopy typically is not done during menstruation.  · For 24 hours before the colposcopy, do not:    Douche.    Use tampons.    Use medicines, creams, or suppositories in the vagina.    Have sexual intercourse.  PROCEDURE   During the procedure, you will be lying on your back with your feet in foot rests (stirrups). A warm  metal or plastic instrument (speculum) will be placed in your vagina to keep it open and to allow the health care provider to see the cervix. The colposcope will be placed outside the vagina. It will be used to magnify and examine the cervix, vagina, and the area around the outside of the vagina. A small amount of liquid solution will be placed on the area that is to be viewed. This solution will make it easier to see the abnormal cells. Your health care provider will use tools to suck out mucus and cells from the canal of the cervix. Then he or she will record the location of the abnormal areas.  If a biopsy is done during the procedure, a medicine will usually be given to numb the area (local anesthetic). You may feel mild pain or cramping while the biopsy is done. After the procedure, tissue samples collected during the biopsy will be sent to a lab for analysis.  AFTER THE PROCEDURE   You will be given instructions on when to follow up with your health care provider for your test results. It is important to keep your appointment.     This information is not intended to replace advice given to you by your health care provider. Make sure you discuss any questions you have with your health care provider.     Document Released: 09/22/2002 Document Revised: 03/04/2013 Document Reviewed: 01/29/2013    Elsevier Interactive Patient Education ©2016 [employer]

## 2016-03-27 ENCOUNTER — Encounter: Payer: Self-pay | Admitting: Obstetrics and Gynecology

## 2016-03-27 NOTE — Progress Notes (Signed)
GYNECOLOGY CLINIC COLPOSCOPY VISIT AND PROCEDURE NOTE  30 y.o. [id number] here for colposcopy for [facility]US with POSITIVE high risk HPV pap smear on 12/20/2015. Prior cervical cytology and/or colposcopy findings: none. The patient reports the following prior treatments to the vulva/vagina/cervix: none. The patient yes a cigarette smoker. The patient not immunosuppressed. The patient not pregnant. The patient no taking anticoagulants and no allergy to iodine. Pt does have a history of cervical cancer in mother  Patient given informed consent, signed copy in the chart, time out was performed. Urine pregnancy test performed and confirmed to be negative.  Placed in lithotomy position.   Gross findings:   Vagina: no health pink mucosa  Vulva: no leasions  Cervix: normal appearing cervix  Visualization after:  Acetic acid: entire SCJ   Lugol's solution: no lesions idenfied  Green or blue filter: no abnormal vessels   Biopsies obtained: random biopsies at 12 and 6 oclock  ECC specimen obtained: yes  Colposcopy adequate? Yes  All specimens were labelled and sent to pathology.   Patient was given post procedure instructions.  Will follow up pathology and manage accordingly.     Additionally, pt is post partum and is asking for a refill on reglan which she uses to help with milk let down. Pt is counseled this is not proven and should only be used PRN not BID on a scheduled basis. Pt was given refill of 40pills for 30 days. Advised should not need any further refills.   Ernestina PennaNicholas Socrates Cahoon, MD OB/GYN Fellow

## 2016-03-29 ENCOUNTER — Telehealth: Payer: Self-pay | Admitting: Obstetrics and Gynecology

## 2016-03-29 NOTE — Telephone Encounter (Signed)
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 Pt will need repeat PAP with cotesting in 1 year.

## 2016-04-02 ENCOUNTER — Encounter: Payer: Self-pay | Admitting: *Deleted

## 2016-04-23 NOTE — Telephone Encounter (Signed)
Letter sent to patient with results and need for repeat testing.

## 2018-02-21 ENCOUNTER — Telehealth (HOSPITAL_COMMUNITY): Payer: Self-pay

## 2018-02-21 NOTE — Telephone Encounter (Signed)
Tried to call I did leave a message °

## 2022-09-15 ENCOUNTER — Ambulatory Visit
Admission: EM | Admit: 2022-09-15 | Discharge: 2022-09-15 | Disposition: A | Discharge status: Home / Self Care | Payer: Self-pay | Attending: Nurse Practitioner | Admitting: Nurse Practitioner

## 2022-09-15 ENCOUNTER — Encounter: Payer: Self-pay | Admitting: Emergency Medicine

## 2022-09-15 DIAGNOSIS — K0889 Other specified disorders of teeth and supporting structures: Secondary | ICD-10-CM

## 2022-09-15 MED ORDER — AMOXICILLIN-POT CLAVULANATE 875-125 MG PO TABS: 1.0000 | ORAL_TABLET | Freq: Two times a day (BID) | ORAL | 0 refills | Status: DC

## 2022-09-15 NOTE — Discharge Instructions (Signed)
  Take medication as prescribed. May take over-the-counter Tylenol extra strength 1 to 2 hours after ibuprofen for breakthrough pain. Warm salt water gargles 3-4 times daily until symptoms improve. Continue warm compresses to the affected area to help with pain and discomfort. It is important for you to follow-up with a dentist to receive proper dental care.  I have provided a dental resource guide for you to follow-up with a dentist.  Recommend following up within the next 7 to 10 days.

## 2022-09-15 NOTE — ED Triage Notes (Signed)
C/o of dental pain all over x "years".  States having pain that radiates up to nose and jaw.  Has taken some of her sisters antibiotic to deal with the pain (amoxicillin '500mg'$ )  States she is also having a productive cough with yellow sputum x 1 year.  Also wants a refill for albuterol inhaler

## 2022-09-15 NOTE — ED Provider Notes (Signed)
[facility]    CSN: [id number] Arrival date & time: 09/15/22  1235      History   Chief Complaint No chief complaint on file.   HPI Joy Herrera is a 37 y.o. female.   The history is provided by the patient.   The patient presents for complaints of dental pain that is been present for the past year or more.  Patient states that since she has had her son, she has had problems with her teeth.  She complains of pain with eating, and swelling to her gums.  She states that she has been unable to see a dentist because she does not have insurance.  She states that she did start taking some of her sisters amoxicillin for her symptoms with minimal relief.  Patient denies fever, chills, chest pain, abdominal pain, nausea, vomiting, or diarrhea.  She also states she has been taking over-the-counter ibuprofen for pain.  Past Medical History:  Diagnosis Date   Anxiety    Asthma     Patient Active Problem List   Diagnosis Date Noted   UTI (urinary tract infection) 11/19/2015   Spontaneous vaginal delivery 11/17/2015   Pregnancy complicated by subutex maintenance, antepartum ([place]) 11/16/2015   Opiate abuse, continuous ([place]) 11/16/2015   Late prenatal care affecting pregnancy 11/16/2015   History of eclampsia 11/16/2015   Opiate addiction ([place]) 01/02/2012   Leukopenia 01/02/2012   Hypokalemia 01/02/2012   Anemia 01/02/2012    History reviewed. No pertinent surgical history.  OB History     Gravida  4   Para  3   Term  2   Preterm  1   AB  1   Living  2      SAB  1   IAB      Ectopic      Multiple  0   Live Births  2            Home Medications    Prior to Admission medications   Medication Sig Start Date End Date Taking? Authorizing Provider  amoxicillin-clavulanate (AUGMENTIN) 875-125 MG tablet Take 1 tablet by mouth every 12 (twelve) hours. 09/15/22  Yes Ustin Cruickshank-Warren, Alda Lea, NP  buprenorphine (SUBUTEX) 8 MG SUBL SL tablet Place 8  mg under the tongue 2 (two) times daily.    [provider]  ibuprofen (ADVIL,MOTRIN) 600 MG tablet Take 1 tablet (600 mg total) by mouth every 6 (six) hours. 11/19/15   Tessie Fass, MD  metoCLOPramide (REGLAN) 5 MG tablet Take 1 tablet (5 mg total) by mouth every 8 (eight) hours as needed for nausea. 03/26/16   Waldemar Dickens, MD  Prenatal Vit-Fe Fumarate-FA (PRENATAL MULTIVITAMIN) TABS tablet Take 1 tablet by mouth daily at 12 noon. 11/19/15   Tessie Fass, MD    Family History Family History  Problem Relation Age of Onset   Cancer Mother        cervical   Cancer Maternal Grandmother        cervical   Hypertension Maternal Grandfather     Social History Social History   Tobacco Use   Smoking status: Every Day    Packs/day: 0.25    Years: 0.00    Total pack years: 0.00    Types: Cigarettes  Vaping Use   Vaping Use: Never used  Substance Use Topics   Alcohol use: No    Comment: none   Drug use: No    Comment: oxycodone- former  SAYS NONE     Allergies   Patient has no known allergies.   Review of Systems Review of Systems Per HPI  Physical Exam Triage Vital Signs ED Triage Vitals  Enc Vitals Group     BP 09/15/22 1239 (!) 147/80     Pulse Rate 09/15/22 1239 97     Resp 09/15/22 1239 18     Temp 09/15/22 1239 98.8 F (37.1 C)     Temp Source 09/15/22 1239 Oral     SpO2 09/15/22 1239 98 %     Weight --      Height --      Head Circumference --      Peak Flow --      Pain Score 09/15/22 1240 7     Pain Loc --      Pain Edu? --      Excl. in [place]? --    No data found.  Updated Vital Signs BP (!) 147/80 (BP Location: Right Arm)   Pulse 97   Temp 98.8 F (37.1 C) (Oral)   Resp 18   LMP 09/12/2022 (Exact Date)   SpO2 98%   Visual Acuity Right Eye Distance:   Left Eye Distance:   Bilateral Distance:    Right Eye Near:   Left Eye Near:    Bilateral Near:     Physical Exam Vitals and nursing note reviewed.   Constitutional:      Appearance: Normal appearance.  HENT:     Mouth/Throat:     Lips: Pink.     Dentition: Abnormal dentition. Dental tenderness, gingival swelling and dental caries present. No dental abscesses.     Pharynx: Oropharynx is clear. Uvula midline.     Comments: Patient with multiple areas of poor dentition throughout her mouth.  Multiple dental caries noted along with fractured teeth.  No obvious abscess present. Eyes:     Extraocular Movements: Extraocular movements intact.     Pupils: Pupils are equal, round, and reactive to light.  Cardiovascular:     Rate and Rhythm: Normal rate and regular rhythm.     Pulses: Normal pulses.     Heart sounds: Normal heart sounds.  Pulmonary:     Effort: Pulmonary effort is normal.     Breath sounds: Normal breath sounds.  Abdominal:     General: Bowel sounds are normal.     Palpations: Abdomen is soft.  Skin:    General: Skin is warm and dry.  Neurological:     General: No focal deficit present.     Mental Status: She is alert and oriented to person, place, and time.  Psychiatric:        Mood and Affect: Mood normal.        Behavior: Behavior normal.      UC Treatments / Results  Labs (all labs ordered are listed, but only abnormal results are displayed) Labs Reviewed - No data to display  EKG   Radiology No results found.  Procedures Procedures (including critical care time)  Medications Ordered in UC Medications - No data to display  Initial Impression / Assessment and Plan / UC Course  I have reviewed the triage vital signs and the nursing notes.  Pertinent labs & imaging results that were available during my care of the patient were reviewed by me and considered in my medical decision making (see chart for details).  The patient is well-appearing, she is in no acute distress, vital signs are stable.  Patient  with multiple areas of poor dentition, dental caries, and fractured teeth.  Will start patient  on Augmentin 875/125 mg daily for the next 7 days.  Supportive care recommendations were provided to the patient along with providing patient with the dental resource guide to help her obtain care for her teeth.  Patient is in agreement with this plan of care and verbalizes understanding.  All questions were answered.  Patient stable for discharge.   Final Clinical Impressions(s) / UC Diagnoses   Final diagnoses:  Dentalgia     Discharge Instructions         Take medication as prescribed. May take over-the-counter Tylenol extra strength 1 to 2 hours after ibuprofen for breakthrough pain. Warm salt water gargles 3-4 times daily until symptoms improve. Continue warm compresses to the affected area to help with pain and discomfort. It is important for you to follow-up with a dentist to receive proper dental care.  I have provided a dental resource guide for you to follow-up with a dentist.  Recommend following up within the next 7 to 10 days.      ED Prescriptions     Medication Sig Dispense Auth. Provider   amoxicillin-clavulanate (AUGMENTIN) 875-125 MG tablet Take 1 tablet by mouth every 12 (twelve) hours. 14 tablet Marcellous Snarski-Warren, Alda Lea, NP      PDMP not reviewed this encounter.   Tish Men, NP 09/15/22 1301

## 2022-09-19 ENCOUNTER — Ambulatory Visit
Admission: EM | Admit: 2022-09-19 | Discharge: 2022-09-19 | Disposition: A | Discharge status: Home / Self Care | Payer: Self-pay | Attending: Nurse Practitioner | Admitting: Nurse Practitioner

## 2022-09-19 ENCOUNTER — Encounter: Payer: Self-pay | Admitting: Emergency Medicine

## 2022-09-19 DIAGNOSIS — K0889 Other specified disorders of teeth and supporting structures: Secondary | ICD-10-CM

## 2022-09-19 MED ORDER — CLINDAMYCIN HCL 150 MG PO CAPS: 450.0000 mg | ORAL_CAPSULE | Freq: Three times a day (TID) | ORAL | 0 refills | Status: AC

## 2022-09-19 NOTE — ED Provider Notes (Signed)
[facility]    CSN: [id number] Arrival date & time: 09/19/22  1549      History   Chief Complaint No chief complaint on file.   HPI Joy Herrera is a 37 y.o. female.   Patient presents today for dental pain that has been ongoing for the past "year."  She was seen on 09/15/2022 and was treated with Augmentin for possible dental infection.  Reports that she has finished the medication and was taking more doses than normal because she was in so much pain.  She reports when she took the antibiotic pill, she felt like the pain went away a little bit.  Reports she is having pain in multiple areas in her mouth.  Has been taking Tylenol multiple times a day, however is afraid to take ibuprofen too much because it may upset her stomach.  Reports she has an appointment with a dentist but is about 1 month away.  No fever, nausea/vomiting.  Reports her face feels swollen.  Patient reports she is no longer lactating.    Past Medical History:  Diagnosis Date   Anxiety    Asthma     Patient Active Problem List   Diagnosis Date Noted   UTI (urinary tract infection) 11/19/2015   Spontaneous vaginal delivery 11/17/2015   Pregnancy complicated by subutex maintenance, antepartum ([place]) 11/16/2015   Opiate abuse, continuous ([place]) 11/16/2015   Late prenatal care affecting pregnancy 11/16/2015   History of eclampsia 11/16/2015   Opiate addiction ([place]) 01/02/2012   Leukopenia 01/02/2012   Hypokalemia 01/02/2012   Anemia 01/02/2012    History reviewed. No pertinent surgical history.  OB History     Gravida  4   Para  3   Term  2   Preterm  1   AB  1   Living  2      SAB  1   IAB      Ectopic      Multiple  0   Live Births  2            Home Medications    Prior to Admission medications   Medication Sig Start Date End Date Taking? Authorizing Provider  clindamycin (CLEOCIN) 150 MG capsule Take 3 capsules (450 mg total) by mouth every 8 (eight)  hours for 7 days. 09/19/22 09/26/22 Yes Eulogio Bear, NP  buprenorphine (SUBUTEX) 8 MG SUBL SL tablet Place 8 mg under the tongue 2 (two) times daily.    [provider]  ibuprofen (ADVIL,MOTRIN) 600 MG tablet Take 1 tablet (600 mg total) by mouth every 6 (six) hours. 11/19/15   Tessie Fass, MD  metoCLOPramide (REGLAN) 5 MG tablet Take 1 tablet (5 mg total) by mouth every 8 (eight) hours as needed for nausea. 03/26/16   Waldemar Dickens, MD  Prenatal Vit-Fe Fumarate-FA (PRENATAL MULTIVITAMIN) TABS tablet Take 1 tablet by mouth daily at 12 noon. 11/19/15   Tessie Fass, MD    Family History Family History  Problem Relation Age of Onset   Cancer Mother        cervical   Cancer Maternal Grandmother        cervical   Hypertension Maternal Grandfather     Social History Social History   Tobacco Use   Smoking status: Every Day    Packs/day: 0.25    Years: 0.00    Total pack years: 0.00    Types: Cigarettes  Vaping Use   Vaping Use: Never  used  Substance Use Topics   Alcohol use: No    Comment: none   Drug use: No    Comment: oxycodone- former    SAYS NONE     Allergies   Patient has no known allergies.   Review of Systems Review of Systems Per HPI  Physical Exam Triage Vital Signs ED Triage Vitals  Enc Vitals Group     BP 09/19/22 1551 137/85     Pulse Rate 09/19/22 1551 93     Resp 09/19/22 1551 18     Temp 09/19/22 1551 98.9 F (37.2 C)     Temp Source 09/19/22 1551 Oral     SpO2 09/19/22 1551 99 %     Weight --      Height --      Head Circumference --      Peak Flow --      Pain Score 09/19/22 1553 10     Pain Loc --      Pain Edu? --      Excl. in [place]? --    No data found.  Updated Vital Signs BP 137/85 (BP Location: Right Arm)   Pulse 93   Temp 98.9 F (37.2 C) (Oral)   Resp 18   LMP 09/12/2022 (Exact Date)   SpO2 99%   Visual Acuity Right Eye Distance:   Left Eye Distance:   Bilateral Distance:    Right Eye Near:    Left Eye Near:    Bilateral Near:     Physical Exam Vitals and nursing note reviewed.  Constitutional:      General: She is not in acute distress.    Appearance: Normal appearance. She is not toxic-appearing.  HENT:     Head: Normocephalic and atraumatic.     Mouth/Throat:     Mouth: Mucous membranes are moist.     Dentition: Abnormal dentition. Gingival swelling and dental caries present. No dental abscesses.     Palate: No mass and lesions.     Pharynx: Oropharynx is clear.     Tonsils: No tonsillar exudate.     Comments: Multiple broken teeth and poor dentition to upper and lower jaws; minimal gingival swelling in multiple areas; no obvious fluctuant areas or abscess Pulmonary:     Effort: Pulmonary effort is normal. No respiratory distress.  Musculoskeletal:     Cervical back: Normal range of motion.  Lymphadenopathy:     Cervical: No cervical adenopathy.  Skin:    General: Skin is warm and dry.     Capillary Refill: Capillary refill takes less than 2 seconds.     Coloration: Skin is not jaundiced or pale.     Findings: No erythema.  Neurological:     Mental Status: She is alert and oriented to person, place, and time.  Psychiatric:        Behavior: Behavior is cooperative.      UC Treatments / Results  Labs (all labs ordered are listed, but only abnormal results are displayed) Labs Reviewed - No data to display  EKG   Radiology No results found.  Procedures Procedures (including critical care time)  Medications Ordered in UC Medications - No data to display  Initial Impression / Assessment and Plan / UC Course  I have reviewed the triage vital signs and the nursing notes.  Pertinent labs & imaging results that were available during my care of the patient were reviewed by me and considered in my medical decision making (see chart for  details).   Patient is well-appearing, normotensive, afebrile, not tachycardic, not tachypneic, oxygenating well on room  air.    1. Dentalgia Lengthy conversation had with patient regarding appropriate use of prescription medications Discussed with her that she is to take antibiotic prescription exactly as prescribed and not increase the dose on her own Since she has run out of Augmentin because she was taking multiple double doses daily, will start clindamycin for 7 days Continue alternating Tylenol and Motrin as needed for pain Recommended follow-up in ER with no improvement or worsening of symptoms despite treatment  The patient was given the opportunity to ask questions.  All questions answered to their satisfaction.  The patient is in agreement to this plan.    Final Clinical Impressions(s) / UC Diagnoses   Final diagnoses:  Dentalgia     Discharge Instructions      Take the Clindamycin exactly as prescribed to treat possible dental infection.  If the pain has not improved in the next 1-2 days, you need to go to the ER.   In the meantime, continue Tylenol [phone] mg every 6 hours alternating with ibuprofen 800 mg every 8 hours as needed for pain.      ED Prescriptions     Medication Sig Dispense Auth. Provider   clindamycin (CLEOCIN) 150 MG capsule Take 3 capsules (450 mg total) by mouth every 8 (eight) hours for 7 days. 63 capsule Eulogio Bear, NP      I have reviewed the PDMP during this encounter.   Eulogio Bear, NP 09/19/22 1620

## 2022-09-19 NOTE — Discharge Instructions (Signed)
Take the Clindamycin exactly as prescribed to treat possible dental infection.  If the pain has not improved in the next 1-2 days, you need to go to the ER.   In the meantime, continue Tylenol [phone] mg every 6 hours alternating with ibuprofen 800 mg every 8 hours as needed for pain.

## 2022-09-19 NOTE — ED Triage Notes (Signed)
Was seen on 3/2 for dental pain.  States she has been taking the medication that was prescribed and states she is still having bad pain.  C/o of pain on right and left side of mouth.  States she has an appointment with the health department in about a month.

## 2023-07-18 ENCOUNTER — Ambulatory Visit
Admission: EM | Admit: 2023-07-18 | Discharge: 2023-07-18 | Disposition: A | Discharge status: Home / Self Care | Payer: Medicaid Other | Attending: Family Medicine | Admitting: Family Medicine

## 2023-07-18 ENCOUNTER — Other Ambulatory Visit: Payer: Self-pay

## 2023-07-18 ENCOUNTER — Encounter: Payer: Self-pay | Admitting: Emergency Medicine

## 2023-07-18 DIAGNOSIS — N898 Other specified noninflammatory disorders of vagina: Secondary | ICD-10-CM | POA: Diagnosis present

## 2023-07-18 DIAGNOSIS — R350 Frequency of micturition: Secondary | ICD-10-CM | POA: Insufficient documentation

## 2023-07-18 LAB — POCT URINALYSIS DIP (MANUAL ENTRY)
Bilirubin, UA: NEGATIVE
Glucose, UA: NEGATIVE mg/dL
Ketones, POC UA: NEGATIVE mg/dL
Leukocytes, UA: NEGATIVE
Nitrite, UA: NEGATIVE
Protein Ur, POC: NEGATIVE mg/dL
Spec Grav, UA: 1.01 (ref 1.010–1.025)
Urobilinogen, UA: 1 U/dL
pH, UA: 7 (ref 5.0–8.0)

## 2023-07-18 NOTE — ED Provider Notes (Signed)
 [facility]    CSN: [id number] Arrival date & time: 07/18/23  1135      History   Chief Complaint Chief Complaint  Patient presents with   Urinary Frequency    HPI Joy Herrera is a 38 y.o. female.   Patient presenting today with several week history of mild vaginal discharge, dark urine, urinary frequency.  Denies pelvic or abdominal pain, bowel changes, known exposures to STIs, concern for pregnancy.  So far not trying anything over-the-counter for symptoms.  LMP 07/18/23.     Past Medical History:  Diagnosis Date   Anxiety    Asthma     Patient Active Problem List   Diagnosis Date Noted   UTI (urinary tract infection) 11/19/2015   Spontaneous vaginal delivery 11/17/2015   Pregnancy complicated by subutex  maintenance, antepartum ([facility]) 11/16/2015   Opiate abuse, continuous ([facility]) 11/16/2015   Late prenatal care affecting pregnancy 11/16/2015   History of eclampsia 11/16/2015   Opiate addiction ([facility]) 01/02/2012   Leukopenia 01/02/2012   Hypokalemia 01/02/2012   Anemia 01/02/2012    History reviewed. No pertinent surgical history.  OB History     Gravida  4   Para  3   Term  2   Preterm  1   AB  1   Living  2      SAB  1   IAB      Ectopic      Multiple  0   Live Births  2            Home Medications    Prior to Admission medications   Medication Sig Start Date End Date Taking? Authorizing Provider  buprenorphine  (SUBUTEX ) 8 MG SUBL SL tablet Place 8 mg under the tongue 2 (two) times daily.    [provider]  ibuprofen  (ADVIL ,MOTRIN ) 600 MG tablet Take 1 tablet (600 mg total) by mouth every 6 (six) hours. 11/19/15   Jackson Burnard BRAVO, MD  metoCLOPramide  (REGLAN ) 5 MG tablet Take 1 tablet (5 mg total) by mouth every 8 (eight) hours as needed for nausea. 03/26/16   Lorenda Mabel Sharper, MD  metroNIDAZOLE  (FLAGYL ) 500 MG tablet Take 1 tablet (500 mg total) by mouth 2 (two) times daily for 7 days. 07/21/23 07/28/23   LampteyAleene KIDD, MD  Prenatal Vit-Fe Fumarate-FA (PRENATAL MULTIVITAMIN) TABS tablet Take 1 tablet by mouth daily at 12 noon. 11/19/15   Jackson Burnard BRAVO, MD    Family History Family History  Problem Relation Age of Onset   Cancer Mother        cervical   Cancer Maternal Grandmother        cervical   Hypertension Maternal Grandfather     Social History Social History   Tobacco Use   Smoking status: Every Day    Current packs/day: 0.25    Types: Cigarettes  Vaping Use   Vaping status: Never Used  Substance Use Topics   Alcohol use: No    Comment: none   Drug use: No    Comment: oxycodone - former    SAYS NONE     Allergies   Patient has no known allergies.   Review of Systems Review of Systems Per HPI  Physical Exam Triage Vital Signs ED Triage Vitals  Encounter Vitals Group     BP 07/18/23 1238 112/79     Systolic BP Percentile --      Diastolic BP Percentile --      Pulse Rate 07/18/23 1238  85     Resp 07/18/23 1238 20     Temp 07/18/23 1238 98.8 F (37.1 C)     Temp Source 07/18/23 1238 Oral     SpO2 07/18/23 1238 99 %     Weight --      Height --      Head Circumference --      Peak Flow --      Pain Score 07/18/23 1229 0     Pain Loc --      Pain Education --      Exclude from Growth Chart --    No data found.  Updated Vital Signs BP 112/79 (BP Location: Right Arm)   Pulse 85   Temp 98.8 F (37.1 C) (Oral)   Resp 20   LMP 07/18/2023 (Approximate)   SpO2 99%   Breastfeeding No   Visual Acuity Right Eye Distance:   Left Eye Distance:   Bilateral Distance:    Right Eye Near:   Left Eye Near:    Bilateral Near:     Physical Exam Vitals and nursing note reviewed.  Constitutional:      Appearance: Normal appearance. She is not ill-appearing.  HENT:     Head: Atraumatic.     Mouth/Throat:     Mouth: Mucous membranes are moist.     Pharynx: Oropharynx is clear.  Eyes:     Extraocular Movements: Extraocular movements intact.      Conjunctiva/sclera: Conjunctivae normal.  Cardiovascular:     Rate and Rhythm: Normal rate and regular rhythm.     Heart sounds: Normal heart sounds.  Pulmonary:     Effort: Pulmonary effort is normal.     Breath sounds: Normal breath sounds.  Musculoskeletal:        General: Normal range of motion.     Cervical back: Normal range of motion and neck supple.  Skin:    General: Skin is warm and dry.  Neurological:     Mental Status: She is alert and oriented to person, place, and time.     Motor: No weakness.     Gait: Gait normal.  Psychiatric:        Mood and Affect: Mood normal.        Thought Content: Thought content normal.        Judgment: Judgment normal.      UC Treatments / Results  Labs (all labs ordered are listed, but only abnormal results are displayed) Labs Reviewed  POCT URINALYSIS DIP (MANUAL ENTRY) - Abnormal; Notable for the following components:      Result Value   Blood, UA large (*)    All other components within normal limits  CERVICOVAGINAL ANCILLARY ONLY - Abnormal; Notable for the following components:   Bacterial Vaginitis (gardnerella) Positive (*)    All other components within normal limits    EKG   Radiology No results found.  Procedures Procedures (including critical care time)  Medications Ordered in UC Medications - No data to display  Initial Impression / Assessment and Plan / UC Course  I have reviewed the triage vital signs and the nursing notes.  Pertinent labs & imaging results that were available during my care of the patient were reviewed by me and considered in my medical decision making (see chart for details).     Vitals and exam very reassuring today, urinalysis without evidence of urinary tract infection, vaginal swab pending.  Discussed supportive over-the-counter products, home care and return precautions.  Will  treat based on results.  Final Clinical Impressions(s) / UC Diagnoses   Final diagnoses:  Urinary  frequency  Vaginal discharge     Discharge Instructions      Your urinalysis today does not show any urinary tract infection.  We have sent out for a vaginal swab which will tell us  about a possible yeast or bacterial vaginal infection.  These can cause all of the same symptoms.  While we are waiting for this to come back, you may take female health probiotics, try boric acid vaginal suppositories daily as needed, drink plenty of water and avoid scented soaps, feminine wipes or feminine washes in this area    ED Prescriptions   None    PDMP not reviewed this encounter.   Stuart Vernell Norris, [place] 07/22/23 1327

## 2023-07-18 NOTE — ED Notes (Signed)
 Pt provided urine and vaginal sample. In process in lab.

## 2023-07-18 NOTE — Discharge Instructions (Signed)
 Your urinalysis today does not show any urinary tract infection.  We have sent out for a vaginal swab which will tell us  about a possible yeast or bacterial vaginal infection.  These can cause all of the same symptoms.  While we are waiting for this to come back, you may take female health probiotics, try boric acid vaginal suppositories daily as needed, drink plenty of water and avoid scented soaps, feminine wipes or feminine washes in this area

## 2023-07-18 NOTE — ED Triage Notes (Addendum)
 Pt reports urinary frequency, dark urine, decreased urine output, vaginal discharge for "awhile."  Vaginal swab and urine sample provided to pt post triage. Pt reports unable to obtain a sample at this time.

## 2023-07-19 LAB — CERVICOVAGINAL ANCILLARY ONLY
Bacterial Vaginitis (gardnerella): POSITIVE — AB
Candida Glabrata: NEGATIVE
Candida Vaginitis: NEGATIVE
Comment: NEGATIVE
Comment: NEGATIVE
Comment: NEGATIVE

## 2023-07-21 ENCOUNTER — Telehealth: Payer: Self-pay

## 2023-07-21 MED ORDER — METRONIDAZOLE 500 MG PO TABS: 500.0000 mg | ORAL_TABLET | Freq: Two times a day (BID) | ORAL | 0 refills | Status: AC

## 2023-07-21 NOTE — Telephone Encounter (Signed)
 Per protocol, pt requires tx with metronidazole. Attempted to reach patient x1. LVM.  Rx sent to pharmacy on file.

## 2023-10-15 ENCOUNTER — Other Ambulatory Visit: Payer: Self-pay

## 2023-10-15 ENCOUNTER — Emergency Department (HOSPITAL_COMMUNITY)
Admission: EM | Admit: 2023-10-15 | Discharge: 2023-10-15 | Discharge status: Against Medical Advice | Attending: Emergency Medicine | Admitting: Emergency Medicine

## 2023-10-15 ENCOUNTER — Emergency Department (HOSPITAL_COMMUNITY)

## 2023-10-15 ENCOUNTER — Encounter (HOSPITAL_COMMUNITY): Payer: Self-pay

## 2023-10-15 DIAGNOSIS — R079 Chest pain, unspecified: Secondary | ICD-10-CM | POA: Insufficient documentation

## 2023-10-15 DIAGNOSIS — R002 Palpitations: Secondary | ICD-10-CM | POA: Diagnosis present

## 2023-10-15 DIAGNOSIS — R3589 Other polyuria: Secondary | ICD-10-CM | POA: Diagnosis not present

## 2023-10-15 DIAGNOSIS — Z5321 Procedure and treatment not carried out due to patient leaving prior to being seen by health care provider: Secondary | ICD-10-CM | POA: Diagnosis not present

## 2023-10-15 NOTE — ED Notes (Signed)
 Pts visitor approached this RN requesting to make a complaint. States that he took his wife to the car and returned and her wheelchair was taken. This RN explained that wheelchairs are used for other patients when not in use. Pts visitor continues to make complaint about wheelchair being taken. Explained once again that wheelchairs have to be used for other patients when not occupied. Pts visitor continuing to harass this RN about wheelchair and states that his wife is in the car waiting to be seen. This RN requested that visitor please have a seat, visitor refused. Security notified and present.

## 2023-10-15 NOTE — ED Notes (Signed)
 Pt was stuck twice. Wasn't successful.

## 2023-10-15 NOTE — ED Triage Notes (Signed)
 Pt here for heart palpitations that started 3 days ago. Pt also c/o polyuria and denies dysuria. C/O chest pain. Denies shob.

## 2023-10-15 NOTE — ED Notes (Signed)
 Pt called x2 for a room with no answer. Pt and family not seen in the ED.

## 2023-10-16 ENCOUNTER — Encounter: Payer: Self-pay | Admitting: Emergency Medicine

## 2023-10-16 ENCOUNTER — Ambulatory Visit
Admission: EM | Admit: 2023-10-16 | Discharge: 2023-10-16 | Disposition: A | Discharge status: Home / Self Care | Attending: Nurse Practitioner | Admitting: Nurse Practitioner

## 2023-10-16 ENCOUNTER — Other Ambulatory Visit: Payer: Self-pay

## 2023-10-16 DIAGNOSIS — N898 Other specified noninflammatory disorders of vagina: Secondary | ICD-10-CM

## 2023-10-16 DIAGNOSIS — N3000 Acute cystitis without hematuria: Secondary | ICD-10-CM

## 2023-10-16 LAB — POCT URINALYSIS DIP (MANUAL ENTRY)
Blood, UA: NEGATIVE
Glucose, UA: NEGATIVE mg/dL
Ketones, POC UA: NEGATIVE mg/dL
Leukocytes, UA: NEGATIVE
Nitrite, UA: NEGATIVE
Protein Ur, POC: 30 mg/dL — AB
Spec Grav, UA: 1.025
Urobilinogen, UA: 1 U/dL
pH, UA: 6.5

## 2023-10-16 MED ORDER — NITROFURANTOIN MONOHYD MACRO 100 MG PO CAPS
100.0000 mg | ORAL_CAPSULE | Freq: Two times a day (BID) | ORAL | 0 refills | Status: AC
Start: 2023-10-16 — End: 2023-10-21

## 2023-10-16 MED ORDER — METRONIDAZOLE 0.75 % VA GEL
1.0000 | Freq: Two times a day (BID) | VAGINAL | 0 refills | Status: AC
Start: 2023-10-16 — End: ?

## 2023-10-16 NOTE — ED Triage Notes (Signed)
 Pt reports lower back pain, urine/vaginal odor x4 days. Pt reports intermittent "restless leg" and reports difficulty doing activities of daily living since pain started.   Pt reports was in ED this week for similar but was never seen. Pt reports has also taken valium and penicillin that had leftover and reports "that's the only reason I'm not in ambulance."

## 2023-10-16 NOTE — Discharge Instructions (Addendum)
 Take the Macrobid to treat for UTI and Metrogel to treat for BV.  We will contact you if the vaginal swab comes back positive for anything else.

## 2023-10-16 NOTE — ED Provider Notes (Signed)
 [facility]    CSN: [id number] Arrival date & time: 10/16/23  1004      History   Chief Complaint Chief Complaint  Patient presents with   Back Pain    HPI Joy Herrera is a 38 y.o. female.   Patient presents today with over 1 month history of burning with urination, increased urinary frequency and voiding smaller amounts.  She also endorses foul urinary odor and dark colored urine.  No hematuria, abdominal pain, flank pain, nausea/vomiting, fever, body aches, or chills.  No nausea or vomiting.  She has been having some vaginal discharge that she describes as white, and smells fishy.  She denies vaginal itching.  She reports she had similar symptoms a couple of months ago and was homeless at that time so could not get her medicine.  She took 2 penicillin pills yesterday without much improvement.  She is sexually active and is requesting STI testing today but declines HIV and RPR testing.    Past Medical History:  Diagnosis Date   Anxiety    Asthma     Patient Active Problem List   Diagnosis Date Noted   UTI (urinary tract infection) 11/19/2015   Spontaneous vaginal delivery 11/17/2015   Pregnancy complicated by subutex maintenance, antepartum ([facility]) 11/16/2015   Opiate abuse, continuous ([facility]) 11/16/2015   Late prenatal care affecting pregnancy 11/16/2015   History of eclampsia 11/16/2015   Opiate addiction ([facility]) 01/02/2012   Leukopenia 01/02/2012   Hypokalemia 01/02/2012   Anemia 01/02/2012    History reviewed. No pertinent surgical history.  OB History     Gravida  4   Para  3   Term  2   Preterm  1   AB  1   Living  2      SAB  1   IAB      Ectopic      Multiple  0   Live Births  2            Home Medications    Prior to Admission medications   Medication Sig Start Date End Date Taking? Authorizing Provider  metroNIDAZOLE (METROGEL) 0.75 % vaginal gel Place 1 Applicatorful vaginally 2 (two) times daily. 10/16/23  Yes  Valentino Nose, NP  nitrofurantoin, macrocrystal-monohydrate, (MACROBID) 100 MG capsule Take 1 capsule (100 mg total) by mouth 2 (two) times daily for 5 days. 10/16/23 10/21/23 Yes Valentino Nose, NP  buprenorphine (SUBUTEX) 8 MG SUBL SL tablet Place 8 mg under the tongue 2 (two) times daily.    [provider]  ibuprofen (ADVIL,MOTRIN) 600 MG tablet Take 1 tablet (600 mg total) by mouth every 6 (six) hours. 11/19/15   Caesar Chestnut, MD  metoCLOPramide (REGLAN) 5 MG tablet Take 1 tablet (5 mg total) by mouth every 8 (eight) hours as needed for nausea. 03/26/16   Lorne Skeens, MD  Prenatal Vit-Fe Fumarate-FA (PRENATAL MULTIVITAMIN) TABS tablet Take 1 tablet by mouth daily at 12 noon. 11/19/15   Caesar Chestnut, MD    Family History Family History  Problem Relation Age of Onset   Cancer Mother        cervical   Cancer Maternal Grandmother        cervical   Hypertension Maternal Grandfather     Social History Social History   Tobacco Use   Smoking status: Every Day    Current packs/day: 0.25    Types: Cigarettes  Vaping Use   Vaping status: Never  Used  Substance Use Topics   Alcohol use: No    Comment: none   Drug use: No    Comment: oxycodone- former SAYS NONE     Allergies   Patient has no known allergies.   Review of Systems Review of Systems Per HPI  Physical Exam Triage Vital Signs ED Triage Vitals  Encounter Vitals Group     BP 10/16/23 1131 111/76     Systolic BP Percentile --      Diastolic BP Percentile --      Pulse Rate 10/16/23 1131 83     Resp 10/16/23 1131 18     Temp 10/16/23 1131 99.1 F (37.3 C)     Temp Source 10/16/23 1131 Oral     SpO2 10/16/23 1131 98 %     Weight --      Height --      Head Circumference --      Peak Flow --      Pain Score 10/16/23 1129 8     Pain Loc --      Pain Education --      Exclude from Growth Chart --    No data found.  Updated Vital Signs BP 111/76 (BP Location: Right Arm)    Pulse 83   Temp 99.1 F (37.3 C) (Oral)   Resp 18   LMP 10/08/2023   SpO2 98%   Visual Acuity Right Eye Distance:   Left Eye Distance:   Bilateral Distance:    Right Eye Near:   Left Eye Near:    Bilateral Near:     Physical Exam Vitals and nursing note reviewed.  Constitutional:      General: She is not in acute distress.    Appearance: She is not toxic-appearing.  Pulmonary:     Effort: Pulmonary effort is normal. No respiratory distress.  Abdominal:     General: Abdomen is flat. Bowel sounds are normal. There is no distension.     Palpations: Abdomen is soft. There is no mass.     Tenderness: There is no abdominal tenderness. There is no right CVA tenderness, left CVA tenderness or guarding.  Genitourinary:    Comments: Deferred - self swab performed by patient Skin:    General: Skin is warm and dry.     Coloration: Skin is not jaundiced or pale.     Findings: No erythema.  Neurological:     Mental Status: She is alert and oriented to person, place, and time.     Motor: No weakness.     Gait: Gait normal.  Psychiatric:        Behavior: Behavior is cooperative.      UC Treatments / Results  Labs (all labs ordered are listed, but only abnormal results are displayed) Labs Reviewed  POCT URINALYSIS DIP (MANUAL ENTRY) - Abnormal; Notable for the following components:      Result Value   Color, UA brown (*)    Clarity, UA cloudy (*)    Bilirubin, UA small (*)    Protein Ur, POC =30 (*)    All other components within normal limits  URINE CULTURE  CERVICOVAGINAL ANCILLARY ONLY    EKG   Radiology  Procedures Procedures (including critical care time)  Medications Ordered in UC Medications - No data to display  Initial Impression / Assessment and Plan / UC Course  I have reviewed the triage vital signs and the nursing notes.  Pertinent labs & imaging results that were available  during my care of the patient were reviewed by me and considered in my  medical decision making (see chart for details).   Patient is well-appearing, normotensive, afebrile, not tachycardic, not tachypneic, oxygenating well on room air.    1. Acute cystitis without hematuria Symptoms are consistent with UTI Given recent penicillin intake, UA and urine culture may not be reliable Treat with Macrobid twice daily for 5 days, encouraged increasing water intake in the meantime and strict ER precautions discussed  2. Vaginal discharge Vaginal cytology is pending Patient declines HIV/RPR today Treat for BV with Metrogel ; treat as indicated if anything else positive Safe sex practices discussed  The patient was given the opportunity to ask questions.  All questions answered to their satisfaction.  The patient is in agreement to this plan.    Final Clinical Impressions(s) / UC Diagnoses   Final diagnoses:  Acute cystitis without hematuria  Vaginal discharge     Discharge Instructions      Take the Macrobid to treat for UTI and Metrogel to treat for BV.  We will contact you if the vaginal swab comes back positive for anything else.     ED Prescriptions     Medication Sig Dispense Auth. Provider   nitrofurantoin, macrocrystal-monohydrate, (MACROBID) 100 MG capsule Take 1 capsule (100 mg total) by mouth 2 (two) times daily for 5 days. 10 capsule Cathlean Marseilles A, NP   metroNIDAZOLE (METROGEL) 0.75 % vaginal gel Place 1 Applicatorful vaginally 2 (two) times daily. 70 g Valentino Nose, NP      PDMP not reviewed this encounter.   Valentino Nose, NP 10/16/23 1327

## 2023-10-17 LAB — CERVICOVAGINAL ANCILLARY ONLY
Chlamydia: NEGATIVE
Comment: NEGATIVE
Comment: NEGATIVE
Comment: NEGATIVE
Comment: NEGATIVE
Comment: NEGATIVE
Comment: NORMAL
Neisseria Gonorrhea: NEGATIVE

## 2023-10-17 LAB — URINE CULTURE

## 2023-10-18 ENCOUNTER — Ambulatory Visit
Admission: EM | Admit: 2023-10-18 | Discharge: 2023-10-18 | Disposition: A | Discharge status: Home / Self Care | Attending: Family Medicine | Admitting: Family Medicine

## 2023-10-18 ENCOUNTER — Telehealth (HOSPITAL_COMMUNITY): Payer: Self-pay

## 2023-10-18 ENCOUNTER — Telehealth: Payer: Self-pay

## 2023-10-18 DIAGNOSIS — Z3400 Encounter for supervision of normal first pregnancy, unspecified trimester: Secondary | ICD-10-CM

## 2023-10-18 DIAGNOSIS — R059 Cough, unspecified: Secondary | ICD-10-CM | POA: Diagnosis present

## 2023-10-18 DIAGNOSIS — F17210 Nicotine dependence, cigarettes, uncomplicated: Secondary | ICD-10-CM | POA: Insufficient documentation

## 2023-10-18 DIAGNOSIS — F1120 Opioid dependence, uncomplicated: Secondary | ICD-10-CM | POA: Diagnosis not present

## 2023-10-18 DIAGNOSIS — B9789 Other viral agents as the cause of diseases classified elsewhere: Secondary | ICD-10-CM | POA: Diagnosis not present

## 2023-10-18 DIAGNOSIS — J069 Acute upper respiratory infection, unspecified: Secondary | ICD-10-CM

## 2023-10-18 DIAGNOSIS — N390 Urinary tract infection, site not specified: Secondary | ICD-10-CM | POA: Diagnosis present

## 2023-10-18 DIAGNOSIS — Z8759 Personal history of other complications of pregnancy, childbirth and the puerperium: Secondary | ICD-10-CM | POA: Diagnosis not present

## 2023-10-18 DIAGNOSIS — O99320 Drug use complicating pregnancy, unspecified trimester: Secondary | ICD-10-CM | POA: Diagnosis present

## 2023-10-18 DIAGNOSIS — F1110 Opioid abuse, uncomplicated: Secondary | ICD-10-CM

## 2023-10-18 DIAGNOSIS — R509 Fever, unspecified: Secondary | ICD-10-CM

## 2023-10-18 DIAGNOSIS — E876 Hypokalemia: Secondary | ICD-10-CM

## 2023-10-18 DIAGNOSIS — R0981 Nasal congestion: Secondary | ICD-10-CM | POA: Diagnosis present

## 2023-10-18 LAB — POC COVID19/FLU A&B COMBO
Covid Antigen, POC: NEGATIVE
Influenza A Antigen, POC: NEGATIVE
Influenza B Antigen, POC: NEGATIVE

## 2023-10-18 MED ORDER — PROMETHAZINE-DM 6.25-15 MG/5ML PO SYRP: 5.0000 mL | ORAL_SOLUTION | Freq: Four times a day (QID) | ORAL | 0 refills | Status: AC | PRN

## 2023-10-18 MED ORDER — FLUTICASONE PROPIONATE 50 MCG/ACT NA SUSP: 1.0000 | Freq: Two times a day (BID) | NASAL | 2 refills | Status: AC

## 2023-10-18 MED ORDER — FLUTICASONE PROPIONATE 50 MCG/ACT NA SUSP: 1.0000 | Freq: Two times a day (BID) | NASAL | 2 refills | Status: DC

## 2023-10-18 MED ORDER — CEPHALEXIN 500 MG PO CAPS: 500.0000 mg | ORAL_CAPSULE | Freq: Two times a day (BID) | ORAL | 0 refills | Status: DC

## 2023-10-18 MED ORDER — CEPHALEXIN 500 MG PO CAPS: 500.0000 mg | ORAL_CAPSULE | Freq: Two times a day (BID) | ORAL | 0 refills | Status: AC

## 2023-10-18 MED ORDER — PROMETHAZINE-DM 6.25-15 MG/5ML PO SYRP: 5.0000 mL | ORAL_SOLUTION | Freq: Four times a day (QID) | ORAL | 0 refills | Status: DC | PRN

## 2023-10-18 NOTE — Discharge Instructions (Signed)
 We have sent out a new urine culture today to ensure we have you on the right antibiotic for your UTI.  I have also sent in some cough syrup and a nasal spray to help with your upper respiratory symptoms.  Drink plenty fluids, get lots of rest and follow-up for worsening symptoms.

## 2023-10-18 NOTE — ED Provider Notes (Signed)
 [facility]    CSN: [id number] Arrival date & time: 10/18/23  1335      History   Chief Complaint No chief complaint on file.   HPI Joy Herrera is a 38 y.o. female.   Patient presenting today initially for a recollection of urine specimen as her urine culture from visit 2 days ago showed multiple species and requested recollection.  She states she was put on Macrobid after this visit for a UTI which seem to be helping with symptoms for the first 12 hours but symptoms have progressively worsened since then.  Still having lower abdominal pain, low back pain bilaterally, fatigue, dysuria.  She is now also developing a cough, congestion, weakness.  Denies chest pain, shortness of breath, vomiting, diarrhea, rashes.  Not tried anything so far for the symptoms.  She has a past history of asthma but states she has not had an issue or needed an inhaler in years.    Past Medical History:  Diagnosis Date   Anxiety    Asthma     Patient Active Problem List   Diagnosis Date Noted   UTI (urinary tract infection) 11/19/2015   Spontaneous vaginal delivery 11/17/2015   Pregnancy complicated by subutex maintenance, antepartum (HCC) 11/16/2015   Opiate abuse, continuous (HCC) 11/16/2015   Late prenatal care affecting pregnancy 11/16/2015   History of eclampsia 11/16/2015   Opiate addiction (HCC) 01/02/2012   Leukopenia 01/02/2012   Hypokalemia 01/02/2012   Anemia 01/02/2012    History reviewed. No pertinent surgical history.  OB History     Gravida  4   Para  3   Term  2   Preterm  1   AB  1   Living  2      SAB  1   IAB      Ectopic      Multiple  0   Live Births  2            Home Medications    Prior to Admission medications   Medication Sig Start Date End Date Taking? Authorizing Provider  buprenorphine (SUBUTEX) 8 MG SUBL SL tablet Place 8 mg under the tongue 2 (two) times daily.    [provider]  cephALEXin (KEFLEX) 500  MG capsule Take 1 capsule (500 mg total) by mouth 2 (two) times daily. 10/18/23   Particia Nearing, PA-C  fluticasone [facility]) 50 MCG/ACT nasal spray Place 1 spray into both nostrils 2 (two) times daily. 10/18/23   Particia Nearing, PA-C  ibuprofen (ADVIL,MOTRIN) 600 MG tablet Take 1 tablet (600 mg total) by mouth every 6 (six) hours. 11/19/15   Caesar Chestnut, MD  metoCLOPramide (REGLAN) 5 MG tablet Take 1 tablet (5 mg total) by mouth every 8 (eight) hours as needed for nausea. 03/26/16   Lorne Skeens, MD  metroNIDAZOLE (METROGEL) 0.75 % vaginal gel Place 1 Applicatorful vaginally 2 (two) times daily. 10/16/23   Valentino Nose, NP  nitrofurantoin, macrocrystal-monohydrate, (MACROBID) 100 MG capsule Take 1 capsule (100 mg total) by mouth 2 (two) times daily for 5 days. 10/16/23 10/21/23  Valentino Nose, NP  Prenatal Vit-Fe Fumarate-FA (PRENATAL MULTIVITAMIN) TABS tablet Take 1 tablet by mouth daily at 12 noon. 11/19/15   Caesar Chestnut, MD  promethazine-dextromethorphan (PROMETHAZINE-DM) 6.25-15 MG/5ML syrup Take 5 mLs by mouth 4 (four) times daily as needed. 10/18/23   Particia Nearing, PA-C    Family History Family History  Problem Relation Age of  Onset   Cancer Mother        cervical   Cancer Maternal Grandmother        cervical   Hypertension Maternal Grandfather     Social History Social History   Tobacco Use   Smoking status: Every Day    Current packs/day: 0.25    Types: Cigarettes  Vaping Use   Vaping status: Never Used  Substance Use Topics   Alcohol use: No    Comment: none   Drug use: No    Comment: oxycodone- former SAYS NONE     Allergies   Patient has no known allergies.   Review of Systems Review of Systems Per HPI  Physical Exam Triage Vital Signs ED Triage Vitals  Encounter Vitals Group     BP 10/18/23 1355 120/78     Systolic BP Percentile --      Diastolic BP Percentile --      Pulse Rate 10/18/23 1355 (!) 114     Resp  10/18/23 1355 16     Temp 10/18/23 1355 98.9 F (37.2 C)     Temp Source 10/18/23 1355 Oral     SpO2 10/18/23 1355 97 %     Weight --      Height --      Head Circumference --      Peak Flow --      Pain Score 10/18/23 1352 0     Pain Loc --      Pain Education --      Exclude from Growth Chart --    No data found.  Updated Vital Signs BP 120/78 (BP Location: Right Arm)   Pulse (!) 114   Temp 98.9 F (37.2 C) (Oral)   Resp 16   LMP 10/08/2023   SpO2 97%   Visual Acuity Right Eye Distance:   Left Eye Distance:   Bilateral Distance:    Right Eye Near:   Left Eye Near:    Bilateral Near:     Physical Exam Vitals and nursing note reviewed.  Constitutional:      Appearance: Normal appearance. She is not ill-appearing.  HENT:     Head: Atraumatic.     Nose: Rhinorrhea present.     Mouth/Throat:     Mouth: Mucous membranes are moist.     Pharynx: Oropharynx is clear.  Eyes:     Extraocular Movements: Extraocular movements intact.     Conjunctiva/sclera: Conjunctivae normal.  Cardiovascular:     Rate and Rhythm: Normal rate and regular rhythm.     Heart sounds: Normal heart sounds.  Pulmonary:     Effort: Pulmonary effort is normal.     Breath sounds: Normal breath sounds. No wheezing or rales.  Abdominal:     General: Bowel sounds are normal. There is no distension.     Palpations: Abdomen is soft.     Tenderness: There is no abdominal tenderness. There is no right CVA tenderness, left CVA tenderness or guarding.  Musculoskeletal:        General: Normal range of motion.     Cervical back: Normal range of motion and neck supple.  Skin:    General: Skin is warm and dry.  Neurological:     Mental Status: She is alert and oriented to person, place, and time.  Psychiatric:        Mood and Affect: Mood normal.        Thought Content: Thought content normal.  Judgment: Judgment normal.      UC Treatments / Results  Labs (all labs ordered are listed,  but only abnormal results are displayed) Labs Reviewed  URINE CULTURE  POC COVID19/FLU A&B COMBO    EKG   Radiology No results found.  Procedures Procedures (including critical care time)  Medications Ordered in UC Medications - No data to display  Initial Impression / Assessment and Plan / UC Course  I have reviewed the triage vital signs and the nursing notes.  Pertinent labs & imaging results that were available during my care of the patient were reviewed by me and considered in my medical decision making (see chart for details).     Will switch antibiotic for UTI to Keflex as Macrobid did not improve symptoms while awaiting urine culture.  Adjust if needed based on these results.  Will treat viral respiratory symptoms with Phenergan DM, Flonase.  Discussed supportive home care and return precautions.  Final Clinical Impressions(s) / UC Diagnoses   Final diagnoses:  Acute lower UTI  Viral URI with cough  Fever, unspecified     Discharge Instructions      We have sent out a new urine culture today to ensure we have you on the right antibiotic for your UTI.  I have also sent in some cough syrup and a nasal spray to help with your upper respiratory symptoms.  Drink plenty fluids, get lots of rest and follow-up for worsening symptoms.    ED Prescriptions     Medication Sig Dispense Auth. Provider   promethazine-dextromethorphan (PROMETHAZINE-DM) 6.25-15 MG/5ML syrup  (Status: Discontinued) Take 5 mLs by mouth 4 (four) times daily as needed. 100 mL Particia Nearing, PA-C   fluticasone [facility]) 50 MCG/ACT nasal spray  (Status: Discontinued) Place 1 spray into both nostrils 2 (two) times daily. 16 g Particia Nearing, PA-C   cephALEXin (KEFLEX) 500 MG capsule  (Status: Discontinued) Take 1 capsule (500 mg total) by mouth 2 (two) times daily. 10 capsule Particia Nearing, PA-C   cephALEXin (KEFLEX) 500 MG capsule Take 1 capsule (500 mg total) by mouth 2  (two) times daily. 10 capsule Particia Nearing, PA-C   fluticasone [facility] 50 MCG/ACT nasal spray Place 1 spray into both nostrils 2 (two) times daily. 16 g Roosvelt Maser [place], [place]   promethazine-dextromethorphan (PROMETHAZINE-DM) 6.25-15 MG/5ML syrup Take 5 mLs by mouth 4 (four) times daily as needed. 100 mL Particia Nearing, [place]      PDMP not reviewed this encounter.   Particia Nearing, [place] 10/18/23 1952

## 2023-10-18 NOTE — ED Triage Notes (Signed)
 Pt reports she is weak, has a cough, fever x 1 day. Pt is still experiencing low abdominal pain and low back pain from her visit x 2 days.

## 2023-10-18 NOTE — Telephone Encounter (Signed)
 Pt called stating her UTI sxs were worsening. X 2 days. Upon checking lab results I seen that her UC showed multiple species present. I informed pt that she will need to come back into the clinic to get her urine recollected. Pt verbalized understanding.

## 2023-10-18 NOTE — Telephone Encounter (Signed)
 Pt may return to UC for Cyto recollection as testing for Trichomonas, BV, and yeast were unable to be completed due to insufficient material. Pt was advised of this via phone. Pt expressed irritation, as she states she was told testing was negative and she had already returned for urine culture recollection.

## 2023-10-19 ENCOUNTER — Ambulatory Visit
Admission: EM | Admit: 2023-10-19 | Discharge: 2023-10-19 | Disposition: A | Discharge status: Home / Self Care | Payer: Self-pay | Attending: Family Medicine | Admitting: Family Medicine

## 2023-10-19 DIAGNOSIS — N760 Acute vaginitis: Secondary | ICD-10-CM | POA: Insufficient documentation

## 2023-10-19 NOTE — ED Triage Notes (Signed)
 Pt returned to UC for recollection of vaginal swab.

## 2023-10-20 LAB — URINE CULTURE

## 2023-10-21 LAB — CERVICOVAGINAL ANCILLARY ONLY
Bacterial Vaginitis (gardnerella): NEGATIVE
Candida Glabrata: POSITIVE — AB
Candida Vaginitis: POSITIVE — AB
Chlamydia: NEGATIVE
Comment: NEGATIVE
Comment: NEGATIVE
Comment: NEGATIVE
Comment: NEGATIVE
Comment: NEGATIVE
Comment: NORMAL
Neisseria Gonorrhea: NEGATIVE
Trichomonas: NEGATIVE

## 2023-10-22 ENCOUNTER — Telehealth (HOSPITAL_COMMUNITY): Payer: Self-pay

## 2023-10-22 MED ORDER — FLUCONAZOLE 150 MG PO TABS: 150.0000 mg | ORAL_TABLET | Freq: Once | ORAL | 0 refills | Status: AC

## 2023-10-22 NOTE — Telephone Encounter (Signed)
 Per protocol, pt requires tx with Diflucan.  Rx sent to pharmacy on file.
